# Patient Record
Sex: Female | Born: 1960
Health system: Southern US, Community
[De-identification: ages and names within clinical notes are randomized; demographics above are authoritative.]

## PROBLEM LIST (undated history)

## (undated) DIAGNOSIS — T8859XA Other complications of anesthesia, initial encounter: Secondary | ICD-10-CM

## (undated) DIAGNOSIS — E119 Type 2 diabetes mellitus without complications: Secondary | ICD-10-CM

## (undated) DIAGNOSIS — K5190 Ulcerative colitis, unspecified, without complications: Secondary | ICD-10-CM

## (undated) DIAGNOSIS — K760 Fatty (change of) liver, not elsewhere classified: Secondary | ICD-10-CM

## (undated) DIAGNOSIS — I10 Essential (primary) hypertension: Secondary | ICD-10-CM

## (undated) DIAGNOSIS — J45909 Unspecified asthma, uncomplicated: Secondary | ICD-10-CM

## (undated) HISTORY — PX: ABDOMINAL HYSTERECTOMY: SHX81

## (undated) HISTORY — PX: CHOLECYSTECTOMY: SHX55

## (undated) HISTORY — DX: Essential (primary) hypertension: I10

## (undated) HISTORY — PX: COLONOSCOPY: SHX174

## (undated) HISTORY — DX: Type 2 diabetes mellitus without complications: E11.9

---

## 2007-01-28 DIAGNOSIS — J40 Bronchitis, not specified as acute or chronic: Secondary | ICD-10-CM | POA: Insufficient documentation

## 2008-01-17 DIAGNOSIS — N946 Dysmenorrhea, unspecified: Secondary | ICD-10-CM | POA: Insufficient documentation

## 2008-06-17 DIAGNOSIS — M79609 Pain in unspecified limb: Secondary | ICD-10-CM | POA: Insufficient documentation

## 2008-08-26 DIAGNOSIS — R109 Unspecified abdominal pain: Secondary | ICD-10-CM | POA: Insufficient documentation

## 2009-06-10 DIAGNOSIS — K5190 Ulcerative colitis, unspecified, without complications: Secondary | ICD-10-CM | POA: Insufficient documentation

## 2009-06-10 DIAGNOSIS — T7840XA Allergy, unspecified, initial encounter: Secondary | ICD-10-CM | POA: Insufficient documentation

## 2009-12-13 DIAGNOSIS — S83419A Sprain of medial collateral ligament of unspecified knee, initial encounter: Secondary | ICD-10-CM | POA: Insufficient documentation

## 2010-02-15 DIAGNOSIS — M654 Radial styloid tenosynovitis [de Quervain]: Secondary | ICD-10-CM | POA: Insufficient documentation

## 2010-03-09 DIAGNOSIS — J069 Acute upper respiratory infection, unspecified: Secondary | ICD-10-CM | POA: Insufficient documentation

## 2017-09-28 DIAGNOSIS — E7800 Pure hypercholesterolemia, unspecified: Secondary | ICD-10-CM | POA: Insufficient documentation

## 2017-09-28 DIAGNOSIS — E119 Type 2 diabetes mellitus without complications: Secondary | ICD-10-CM | POA: Insufficient documentation

## 2017-10-01 ENCOUNTER — Other Ambulatory Visit: Payer: Self-pay | Admitting: Internal Medicine

## 2017-10-01 DIAGNOSIS — Z1231 Encounter for screening mammogram for malignant neoplasm of breast: Secondary | ICD-10-CM

## 2017-10-02 ENCOUNTER — Other Ambulatory Visit: Payer: Self-pay | Admitting: Internal Medicine

## 2017-10-02 DIAGNOSIS — R748 Abnormal levels of other serum enzymes: Secondary | ICD-10-CM

## 2017-10-02 DIAGNOSIS — Z8719 Personal history of other diseases of the digestive system: Secondary | ICD-10-CM

## 2017-10-04 ENCOUNTER — Ambulatory Visit
Admission: RE | Admit: 2017-10-04 | Discharge: 2017-10-04 | Disposition: A | Discharge status: Home / Self Care | Payer: BLUE CROSS/BLUE SHIELD | Source: Ambulatory Visit | Attending: Internal Medicine | Admitting: Internal Medicine

## 2017-10-04 DIAGNOSIS — R748 Abnormal levels of other serum enzymes: Secondary | ICD-10-CM

## 2017-10-04 DIAGNOSIS — Z8719 Personal history of other diseases of the digestive system: Secondary | ICD-10-CM | POA: Diagnosis not present

## 2017-10-04 DIAGNOSIS — Z9049 Acquired absence of other specified parts of digestive tract: Secondary | ICD-10-CM | POA: Insufficient documentation

## 2017-10-04 DIAGNOSIS — R932 Abnormal findings on diagnostic imaging of liver and biliary tract: Secondary | ICD-10-CM | POA: Diagnosis not present

## 2017-10-30 ENCOUNTER — Encounter (HOSPITAL_COMMUNITY): Payer: Self-pay

## 2017-10-30 ENCOUNTER — Ambulatory Visit
Admission: RE | Admit: 2017-10-30 | Discharge: 2017-10-30 | Disposition: A | Discharge status: Home / Self Care | Payer: BLUE CROSS/BLUE SHIELD | Source: Ambulatory Visit | Attending: Internal Medicine | Admitting: Internal Medicine

## 2017-10-30 DIAGNOSIS — Z1231 Encounter for screening mammogram for malignant neoplasm of breast: Secondary | ICD-10-CM | POA: Diagnosis not present

## 2017-11-14 ENCOUNTER — Inpatient Hospital Stay
Admission: RE | Admit: 2017-11-14 | Discharge: 2017-11-14 | Disposition: A | Discharge status: Home / Self Care | Payer: Self-pay | Source: Ambulatory Visit | Attending: *Deleted | Admitting: *Deleted

## 2017-11-14 ENCOUNTER — Other Ambulatory Visit: Payer: Self-pay | Admitting: *Deleted

## 2017-11-14 DIAGNOSIS — Z9289 Personal history of other medical treatment: Secondary | ICD-10-CM

## 2018-01-07 DIAGNOSIS — Z8719 Personal history of other diseases of the digestive system: Secondary | ICD-10-CM | POA: Insufficient documentation

## 2018-01-07 DIAGNOSIS — R030 Elevated blood-pressure reading, without diagnosis of hypertension: Secondary | ICD-10-CM | POA: Insufficient documentation

## 2018-01-07 DIAGNOSIS — R748 Abnormal levels of other serum enzymes: Secondary | ICD-10-CM | POA: Insufficient documentation

## 2018-10-10 DIAGNOSIS — I10 Essential (primary) hypertension: Secondary | ICD-10-CM | POA: Insufficient documentation

## 2018-10-21 ENCOUNTER — Other Ambulatory Visit: Payer: Self-pay | Admitting: Internal Medicine

## 2018-10-21 DIAGNOSIS — Z1231 Encounter for screening mammogram for malignant neoplasm of breast: Secondary | ICD-10-CM

## 2018-11-18 ENCOUNTER — Ambulatory Visit
Admission: RE | Admit: 2018-11-18 | Discharge: 2018-11-18 | Disposition: A | Discharge status: Home / Self Care | Payer: BC Managed Care – PPO | Source: Ambulatory Visit | Attending: Internal Medicine | Admitting: Internal Medicine

## 2018-11-18 DIAGNOSIS — Z1231 Encounter for screening mammogram for malignant neoplasm of breast: Secondary | ICD-10-CM | POA: Diagnosis not present

## 2018-11-21 ENCOUNTER — Other Ambulatory Visit: Payer: Self-pay | Admitting: Internal Medicine

## 2018-11-21 DIAGNOSIS — R921 Mammographic calcification found on diagnostic imaging of breast: Secondary | ICD-10-CM

## 2018-11-21 DIAGNOSIS — N6310 Unspecified lump in the right breast, unspecified quadrant: Secondary | ICD-10-CM

## 2018-11-21 DIAGNOSIS — R928 Other abnormal and inconclusive findings on diagnostic imaging of breast: Secondary | ICD-10-CM

## 2018-12-05 ENCOUNTER — Ambulatory Visit
Admission: RE | Admit: 2018-12-05 | Discharge: 2018-12-05 | Disposition: A | Discharge status: Home / Self Care | Payer: BC Managed Care – PPO | Source: Ambulatory Visit | Attending: Internal Medicine | Admitting: Internal Medicine

## 2018-12-05 DIAGNOSIS — N6310 Unspecified lump in the right breast, unspecified quadrant: Secondary | ICD-10-CM | POA: Diagnosis present

## 2018-12-05 DIAGNOSIS — R921 Mammographic calcification found on diagnostic imaging of breast: Secondary | ICD-10-CM

## 2018-12-05 DIAGNOSIS — R928 Other abnormal and inconclusive findings on diagnostic imaging of breast: Secondary | ICD-10-CM | POA: Insufficient documentation

## 2018-12-26 ENCOUNTER — Other Ambulatory Visit (HOSPITAL_COMMUNITY): Payer: Self-pay | Admitting: Internal Medicine

## 2018-12-26 ENCOUNTER — Ambulatory Visit
Admission: RE | Admit: 2018-12-26 | Discharge: 2018-12-26 | Disposition: A | Discharge status: Home / Self Care | Payer: BC Managed Care – PPO | Source: Ambulatory Visit | Attending: Internal Medicine | Admitting: Internal Medicine

## 2018-12-26 ENCOUNTER — Other Ambulatory Visit: Payer: Self-pay | Admitting: Internal Medicine

## 2018-12-26 ENCOUNTER — Other Ambulatory Visit: Payer: Self-pay

## 2018-12-26 DIAGNOSIS — R600 Localized edema: Secondary | ICD-10-CM

## 2019-01-14 ENCOUNTER — Ambulatory Visit (INDEPENDENT_AMBULATORY_CARE_PROVIDER_SITE_OTHER): Payer: BC Managed Care – PPO | Admitting: Vascular Surgery

## 2019-01-14 ENCOUNTER — Encounter (INDEPENDENT_AMBULATORY_CARE_PROVIDER_SITE_OTHER): Payer: Self-pay

## 2019-01-14 ENCOUNTER — Other Ambulatory Visit: Payer: Self-pay

## 2019-01-14 ENCOUNTER — Encounter (INDEPENDENT_AMBULATORY_CARE_PROVIDER_SITE_OTHER): Payer: Self-pay | Admitting: Vascular Surgery

## 2019-01-14 VITALS — BP 135/83 | HR 77 | Resp 16 | Wt 230.0 lb

## 2019-01-14 DIAGNOSIS — I10 Essential (primary) hypertension: Secondary | ICD-10-CM

## 2019-01-14 DIAGNOSIS — E119 Type 2 diabetes mellitus without complications: Secondary | ICD-10-CM

## 2019-01-14 DIAGNOSIS — I890 Lymphedema, not elsewhere classified: Secondary | ICD-10-CM | POA: Diagnosis not present

## 2019-01-14 DIAGNOSIS — M79602 Pain in left arm: Secondary | ICD-10-CM | POA: Diagnosis not present

## 2019-01-14 NOTE — Assessment & Plan Note (Signed)
Left arm Patient has persistent pain and swelling in the left arm a couple of years after a trauma to the left shoulder and upper arm area.  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  At this point, a compression sleeve was prescribed to the patient and I believe she would benefit from a lymphedema pump of the left arm.  Continue to elevate the arm and exercise it as tolerated will be helpful.  If she continues to have symptoms despite appropriate compression, elevation, and the addition of a lymphedema pump I would likely recommend a venogram of the left upper extremity to evaluate the central venous circulation more thoroughly than can be seen on ultrasound.  She may also need reevaluated by an orthopedic specialist to look at her shoulder again as there could be continued issues coming from this.  I have discussed the pathophysiology and natural history of lymphedema with the patient today.  She voices her understanding and is agreeable to proceed with our plan of care.

## 2019-01-14 NOTE — Progress Notes (Signed)
Patient ID: Connie Salazar, female   DOB: 1960-07-21, 58 y.o.   MRN: [id number]  Chief Complaint  Patient presents with   New Patient (Initial Visit)    ref Letitia LibraJohnston UE lymphedema    HPI Connie KocherRegina Majewski is a 58 y.o. female.  I am asked to see the patient by Dr. Letitia LibraJohnston for evaluation of arm swelling.  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  It took a very long time for the pain in that area to get better, but even after the pain improved the left arm remains swollen.  Despite attempting to elevate her arm, sleep with the arm straight, use the arm normally, she continues to have left arm swelling.  She had a negative ultrasound for DVT a couple of weeks ago and does not have a history of surgery on the left arm or axilla.  No previous history of DVT or superficial thrombus to her knowledge.  She denies any fevers or chills.  No chest pain or shortness of breath.  No right arm symptoms.  At this point, her primary care physician told her she had lymphedema and referred her for further evaluation and treatment.     Past Medical History:  Diagnosis Date   Diabetes mellitus without complication (HCC)    Hypertension     Past Surgical History:  Procedure Laterality Date   ABDOMINAL HYSTERECTOMY     CHOLECYSTECTOMY     COLONOSCOPY       Family History  Problem Relation Age of Onset   Breast cancer Mother 3940  No bleeding disorders, clotting disorders, autoimmune diseases or aneurysms   Social History   Tobacco Use   Smoking status: Never Smoker   Smokeless tobacco: Never Used  Substance Use Topics   Alcohol use: Yes    Alcohol/week: 1.0 standard drinks    Types: 1 Glasses of wine per week    Comment: ocassionally   Drug use: Never    Allergies  Allergen Reactions   Mesalamine Anxiety    Other reaction(s): Abdominal Pain    Current Outpatient Medications  Medication Sig Dispense Refill    acetaminophen (TYLENOL) 500 MG tablet Take by mouth.     atorvastatin (LIPITOR) 40 MG tablet      lisinopril (ZESTRIL) 5 MG tablet Take 5 mg by mouth daily.     metFORMIN (GLUCOPHAGE) 500 MG tablet Take by mouth.     No current facility-administered medications for this visit.       REVIEW OF SYSTEMS (Negative unless checked)  Constitutional: [] Weight loss  [] Fever  [] Chills Cardiac: [] Chest pain   [] Chest pressure   [] Palpitations   [] Shortness of breath when laying flat   [] Shortness of breath at rest   [] Shortness of breath with exertion. Vascular:  [] Pain in legs with walking   [] Pain in legs at rest   [] Pain in legs when laying flat   [] Claudication   [] Pain in feet when walking  [] Pain in feet at rest  [] Pain in feet when laying flat   [] History of DVT   [] Phlebitis   [] Swelling in legs   [] Varicose veins   [] Non-healing ulcers X positive for swelling in left arm  Pulmonary:   [] Uses home oxygen   [] Productive cough   [] Hemoptysis   [] Wheeze  [] COPD   [] Asthma Neurologic:  [] Dizziness  [] Blackouts   [] Seizures   [] History of stroke   [] History  of TIA  [] Aphasia   [] Temporary blindness   [] Dysphagia   [] Weakness or numbness in arms   [] Weakness or numbness in legs Musculoskeletal:  [x] Arthritis   [] Joint swelling   [] Joint pain   [] Low back pain Hematologic:  [] Easy bruising  [] Easy bleeding   [] Hypercoagulable state   [] Anemic  [] Hepatitis Gastrointestinal:  [] Blood in stool   [] Vomiting blood  [] Gastroesophageal reflux/heartburn   [] Abdominal pain Genitourinary:  [] Chronic kidney disease   [] Difficult urination  [] Frequent urination  [] Burning with urination   [] Hematuria Skin:  [] Rashes   [] Ulcers   [] Wounds Psychological:  [] History of anxiety   []  History of major depression.    Physical Exam BP 135/83 (BP Location: Right Arm)    Pulse 77    Resp 16    Wt 230 lb (104.3 kg)  Gen:  WD/WN, NAD Head: [place]/AT, No temporalis wasting.  Ear/Nose/Throat: Hearing grossly intact, nares  w/o erythema or drainage, oropharynx w/o Erythema/Exudate Eyes: Conjunctiva clear, sclera non-icteric  Neck: trachea midline.  No JVD.  Pulmonary:  Good air movement, respirations not labored, no use of accessory muscles  Cardiac: RRR, no JVD Vascular:  Vessel Right Left  Radial 1+ Palpable Palpable                                   Gastrointestinal:. No masses, surgical incisions, or scars. Musculoskeletal: M/S 5/5 throughout.  Extremities without ischemic changes.  No deformity or atrophy.  1-2+ left upper extremity edema. Neurologic: Sensation grossly intact in extremities.  Symmetrical.  Speech is fluent. Motor exam as listed above. Psychiatric: Judgment intact, Mood & affect appropriate for pt's clinical situation. Dermatologic: No rashes or ulcers noted.  No cellulitis or open wounds.    Radiology US Venous Img Upper Uni Left  Result Date: 12/26/2018 CLINICAL DATA:  58 year old female with left upper extremity edema EXAM: LEFT UPPER EXTREMITY VENOUS DOPPLER ULTRASOUND TECHNIQUE: Gray-scale sonography with graded compression, as well as color Doppler and duplex ultrasound were performed to evaluate the upper extremity deep venous system from the level of the subclavian vein and including the jugular, axillary, basilic, radial, ulnar and upper cephalic vein. Spectral Doppler was utilized to evaluate flow at rest and with distal augmentation maneuvers. COMPARISON:  None. FINDINGS: Contralateral Subclavian Vein: Respiratory phasicity is normal and symmetric with the symptomatic side. No evidence of thrombus. Normal compressibility. Internal Jugular Vein: No evidence of thrombus. Normal compressibility, respiratory phasicity and response to augmentation. Subclavian Vein: No evidence of thrombus. Normal compressibility, respiratory phasicity and response to augmentation. Axillary Vein: No evidence of thrombus. Normal compressibility, respiratory phasicity and response to augmentation.  Cephalic Vein: No evidence of thrombus. Normal compressibility, respiratory phasicity and response to augmentation. Basilic Vein: No evidence of thrombus. Normal compressibility, respiratory phasicity and response to augmentation. Brachial Veins: No evidence of thrombus. Normal compressibility, respiratory phasicity and response to augmentation. Radial Veins: No evidence of thrombus. Normal compressibility, respiratory phasicity and response to augmentation. Ulnar Veins: No evidence of thrombus. Normal compressibility, respiratory phasicity and response to augmentation. Venous Reflux:  None visualized. Other Findings:  None visualized. IMPRESSION: No evidence of DVT within the left upper extremity. Electronically Signed   By: Jacqulynn Cadet M.D.   On: 12/26/2018 13:58    Labs No results found for this or any previous visit (from the past 2160 hour(s)).  Assessment/Plan:  Type 2 diabetes mellitus without complication, without long-term current use of insulin (  HCC) blood glucose control important in reducing the progression of atherosclerotic disease. Also, involved in wound healing. On appropriate medications.   Essential hypertension blood pressure control important in reducing the progression of atherosclerotic disease. On appropriate oral medications.   Pain in limb Patient has persistent pain and swelling in the left arm a couple of years after a trauma to the left shoulder and upper arm area.  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  At this point, a compression sleeve was prescribed to the patient and I believe she would benefit from a lymphedema pump of the left arm.  Continue to elevate the arm and exercise it as tolerated will be helpful.  If she continues to have symptoms despite appropriate compression, elevation, and the addition of a lymphedema pump I would likely recommend a venogram of the left upper extremity to evaluate the central venous circulation more  thoroughly than can be seen on ultrasound.  She may also need reevaluated by an orthopedic specialist to look at her shoulder again as there could be continued issues coming from this.  I have discussed the pathophysiology and natural history of lymphedema with the patient today.  She voices her understanding and is agreeable to proceed with our plan of care.  Lymphedema Left arm Patient has persistent pain and swelling in the left arm a couple of years after a trauma to the left shoulder and upper arm area.  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  At this point, a compression sleeve was prescribed to the patient and I believe she would benefit from a lymphedema pump of the left arm.  Continue to elevate the arm and exercise it as tolerated will be helpful.  If she continues to have symptoms despite appropriate compression, elevation, and the addition of a lymphedema pump I would likely recommend a venogram of the left upper extremity to evaluate the central venous circulation more thoroughly than can be seen on ultrasound.  She may also need reevaluated by an orthopedic specialist to look at her shoulder again as there could be continued issues coming from this.  I have discussed the pathophysiology and natural history of lymphedema with the patient today.  She voices her understanding and is agreeable to proceed with our plan of care.      Festus Barren 01/14/2019, 9:07 AM   This note was created with Dragon medical transcription system.  Any errors from dictation are unintentional.

## 2019-01-14 NOTE — Patient Instructions (Signed)

## 2019-01-14 NOTE — Assessment & Plan Note (Signed)
blood pressure control important in reducing the progression of atherosclerotic disease. On appropriate oral medications.  

## 2019-01-14 NOTE — Assessment & Plan Note (Signed)
blood glucose control important in reducing the progression of atherosclerotic disease. Also, involved in wound healing. On appropriate medications.  

## 2019-01-14 NOTE — Assessment & Plan Note (Signed)
Patient has persistent pain and swelling in the left arm a couple of years after a trauma to the left shoulder and upper arm area.  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  At this point, a compression sleeve was prescribed to the patient and I believe she would benefit from a lymphedema pump of the left arm.  Continue to elevate the arm and exercise it as tolerated will be helpful.  If she continues to have symptoms despite appropriate compression, elevation, and the addition of a lymphedema pump I would likely recommend a venogram of the left upper extremity to evaluate the central venous circulation more thoroughly than can be seen on ultrasound.  She may also need reevaluated by an orthopedic specialist to look at her shoulder again as there could be continued issues coming from this.  I have discussed the pathophysiology and natural history of lymphedema with the patient today.  She voices her understanding and is agreeable to proceed with our plan of care.

## 2019-02-18 ENCOUNTER — Encounter (INDEPENDENT_AMBULATORY_CARE_PROVIDER_SITE_OTHER): Payer: Self-pay | Admitting: Vascular Surgery

## 2019-02-18 ENCOUNTER — Ambulatory Visit (INDEPENDENT_AMBULATORY_CARE_PROVIDER_SITE_OTHER): Payer: BC Managed Care – PPO | Admitting: Vascular Surgery

## 2019-02-18 ENCOUNTER — Other Ambulatory Visit: Payer: Self-pay

## 2019-02-18 VITALS — BP 143/87 | HR 80 | Resp 19 | Ht 65.0 in | Wt 231.0 lb

## 2019-02-18 DIAGNOSIS — I10 Essential (primary) hypertension: Secondary | ICD-10-CM | POA: Diagnosis not present

## 2019-02-18 DIAGNOSIS — I890 Lymphedema, not elsewhere classified: Secondary | ICD-10-CM | POA: Diagnosis not present

## 2019-02-18 DIAGNOSIS — M79602 Pain in left arm: Secondary | ICD-10-CM

## 2019-02-18 DIAGNOSIS — E119 Type 2 diabetes mellitus without complications: Secondary | ICD-10-CM

## 2019-02-18 NOTE — Progress Notes (Signed)
MRN : [id number]  Connie Salazar is a 58 y.o. (1960/03/28) female who presents with chief complaint of  Chief Complaint  Patient presents with  . Follow-up  .  History of Present Illness: Patient returns today in follow up of her left arm lymphedema.  She has been diligently wearing her compression sleeve and had to get a slightly more robust sleeve as her initial one was rolling down.  Wearing this daily has resulted in a significant improvement in her left upper extremity swelling that is easily noticeable today.  She does say her arm is actually hurting more now than it was and she thinks this may be coming from her shoulder.  She has been evaluated by the lymphedema pump company but has not yet received the device  Current Outpatient Medications  Medication Sig Dispense Refill  . acetaminophen (TYLENOL) 500 MG tablet Take by mouth.    Marland Kitchen atorvastatin (LIPITOR) 40 MG tablet     . lisinopril (ZESTRIL) 5 MG tablet Take 5 mg by mouth daily.    . metFORMIN (GLUCOPHAGE) 500 MG tablet Take by mouth.     No current facility-administered medications for this visit.    Past Medical History:  Diagnosis Date  . Diabetes mellitus without complication ([place])   . Hypertension     Past Surgical History:  Procedure Laterality Date  . ABDOMINAL HYSTERECTOMY    . CHOLECYSTECTOMY    . COLONOSCOPY      Social History Social History   Tobacco Use  . Smoking status: Never Smoker  . Smokeless tobacco: Never Used  Substance Use Topics  . Alcohol use: Yes    Alcohol/week: 1.0 standard drinks    Types: 1 Glasses of wine per week    Comment: ocassionally  . Drug use: Never    Family History  Problem Relation Age of Onset  . Breast cancer Mother 12     Allergies  Allergen Reactions  . Mesalamine Anxiety    Other reaction(s): Abdominal Pain     REVIEW OF SYSTEMS (Negative unless checked)  Constitutional: [] Weight loss  [] Fever  [] Chills Cardiac: [] Chest pain   [] Chest pressure    [] Palpitations   [] Shortness of breath when laying flat   [] Shortness of breath at rest   [] Shortness of breath with exertion. Vascular:  [] Pain in legs with walking   [] Pain in legs at rest   [] Pain in legs when laying flat   [] Claudication   [] Pain in feet when walking  [] Pain in feet at rest  [] Pain in feet when laying flat   [] History of DVT   [] Phlebitis   [] Swelling in legs   [] Varicose veins   [] Non-healing ulcers Pulmonary:   [] Uses home oxygen   [] Productive cough   [] Hemoptysis   [] Wheeze  [] COPD   [] Asthma Neurologic:  [] Dizziness  [] Blackouts   [] Seizures   [] History of stroke   [] History of TIA  [] Aphasia   [] Temporary blindness   [] Dysphagia   [] Weakness or numbness in arms   [] Weakness or numbness in legs Musculoskeletal:  [x] Arthritis   [] Joint swelling   [] Joint pain   [] Low back pain Hematologic:  [] Easy bruising  [] Easy bleeding   [] Hypercoagulable state   [] Anemic   Gastrointestinal:  [] Blood in stool   [] Vomiting blood  [] Gastroesophageal reflux/heartburn   [] Abdominal pain Genitourinary:  [] Chronic kidney disease   [] Difficult urination  [] Frequent urination  [] Burning with urination   [] Hematuria Skin:  [] Rashes   [] Ulcers   [] Wounds Psychological:  []   History of anxiety   []  History of major depression.  Physical Examination  BP (!) 143/87 (BP Location: Right Arm)   Pulse 80   Resp 19   Ht 5\' 5"  (1.651 m)   Wt 231 lb (104.8 kg)   BMI 38.44 kg/m  Gen:  WD/WN, NAD Head: [place]/AT, No temporalis wasting. Ear/Nose/Throat: Hearing grossly intact, nares w/o erythema or drainage Eyes: Conjunctiva clear. Sclera non-icteric Neck: Supple.  Trachea midline Pulmonary:  Good air movement, no use of accessory muscles.  Cardiac: RRR, no JVD Vascular:  Vessel Right Left  Radial Palpable Palpable                           Musculoskeletal: M/S 5/5 throughout.  No deformity or atrophy.  Fairly mild left upper extremity edema. Neurologic: Sensation grossly intact in extremities.   Symmetrical.  Speech is fluent.  Psychiatric: Judgment intact, Mood & affect appropriate for pt's clinical situation. Dermatologic: No rashes or ulcers noted.  No cellulitis or open wounds.       Labs No results found for this or any previous visit (from the past 2160 hour(s)).  Radiology No results found.  Assessment/Plan Type 2 diabetes mellitus without complication, without long-term current use of insulin (HCC) blood glucose control important in reducing the progression of atherosclerotic disease. Also, involved in wound healing. On appropriate medications.   Essential hypertension blood pressure control important in reducing the progression of atherosclerotic disease. On appropriate oral medications.  Pain in limb Left arm pain is actually a little worse after her swelling has improved.  Would suspect there is some sort of shoulder or musculoskeletal issue going on.  Lymphedema Wearing her compression sleeve on her left arm has significantly improved her swelling.  I will hold on the consideration for venogram but will continue to proceed with the expected use of the lymphedema pump at this time.  I do think the lymphedema pump will be a good adjuvant therapy to improve her symptoms in the left arm and continue to reduce her swelling.  I will plan to see her back in 3 months and if her swelling is worsened we can certainly consider a venogram in the future    , MD  02/18/2019 9:10 AM    This note was created with Dragon medical transcription system.  Any errors from dictation are purely unintentional

## 2019-02-18 NOTE — Patient Instructions (Signed)

## 2019-02-18 NOTE — Assessment & Plan Note (Signed)
Wearing her compression sleeve on her left arm has significantly improved her swelling.  I will hold on the consideration for venogram but will continue to proceed with the expected use of the lymphedema pump at this time.  I do think the lymphedema pump will be a good adjuvant therapy to improve her symptoms in the left arm and continue to reduce her swelling.  I will plan to see her back in 3 months and if her swelling is worsened we can certainly consider a venogram in the future

## 2019-02-18 NOTE — Assessment & Plan Note (Signed)
Left arm pain is actually a little worse after her swelling has improved.  Would suspect there is some sort of shoulder or musculoskeletal issue going on.

## 2019-03-18 ENCOUNTER — Encounter (INDEPENDENT_AMBULATORY_CARE_PROVIDER_SITE_OTHER): Payer: Self-pay

## 2019-04-28 ENCOUNTER — Encounter (INDEPENDENT_AMBULATORY_CARE_PROVIDER_SITE_OTHER): Payer: Self-pay

## 2019-05-20 ENCOUNTER — Ambulatory Visit (INDEPENDENT_AMBULATORY_CARE_PROVIDER_SITE_OTHER): Payer: Self-pay | Admitting: Vascular Surgery

## 2019-06-03 ENCOUNTER — Ambulatory Visit (INDEPENDENT_AMBULATORY_CARE_PROVIDER_SITE_OTHER): Payer: 59 | Admitting: Vascular Surgery

## 2019-06-03 ENCOUNTER — Encounter (INDEPENDENT_AMBULATORY_CARE_PROVIDER_SITE_OTHER): Payer: Self-pay | Admitting: Vascular Surgery

## 2019-06-03 ENCOUNTER — Other Ambulatory Visit: Payer: Self-pay

## 2019-06-03 VITALS — BP 131/83 | HR 80 | Resp 16 | Ht 65.0 in | Wt 235.0 lb

## 2019-06-03 DIAGNOSIS — E119 Type 2 diabetes mellitus without complications: Secondary | ICD-10-CM | POA: Diagnosis not present

## 2019-06-03 DIAGNOSIS — I10 Essential (primary) hypertension: Secondary | ICD-10-CM

## 2019-06-03 DIAGNOSIS — M79602 Pain in left arm: Secondary | ICD-10-CM | POA: Diagnosis not present

## 2019-06-03 DIAGNOSIS — I890 Lymphedema, not elsewhere classified: Secondary | ICD-10-CM

## 2019-06-03 NOTE — Assessment & Plan Note (Signed)
Even with her swelling getting better, her left arm and shoulder pain seem to be stable to worse.  I recommended she be evaluated by an orthopedic surgeon to see if there are any physical therapy or exercises that may help her arm pain.  Her lymphedema is under better but not perfect control.  Were going to continue to use the lymphedema pump and the sleeve and elevate her arm.

## 2019-06-03 NOTE — Assessment & Plan Note (Signed)
Lymphedema pump has improved her swelling as has the compression sleeve.  Swelling is still present but much better.  Continue to use these agents and we will lengthen her follow-up to 6 months at this point.

## 2019-06-03 NOTE — Progress Notes (Signed)
MRN : [id number]  Connie Salazar is a 59 y.o. (November 11, 1960) female who presents with chief complaint of  Chief Complaint  Patient presents with  . Follow-up    3 mos no studies   .  History of Present Illness: Patient returns today in follow up of her left arm lymphedema.  She did get her lymphedema pump and has been using that daily.  Her swelling is under good control.  Her arms are not symmetric, but there certainly a lot closer than they were 6 months ago.  She is still having a fair bit of pain in her left shoulder that is radiating down the outside of her arm.  She has not yet seen an orthopedic surgeon.  Current Outpatient Medications  Medication Sig Dispense Refill  . acetaminophen (TYLENOL) 500 MG tablet Take by mouth.    Marland Kitchen atorvastatin (LIPITOR) 40 MG tablet     . lisinopril (ZESTRIL) 5 MG tablet Take 5 mg by mouth daily.    . metFORMIN (GLUCOPHAGE) 500 MG tablet Take by mouth.     No current facility-administered medications for this visit.    Past Medical History:  Diagnosis Date  . Diabetes mellitus without complication (HCC)   . Hypertension     Past Surgical History:  Procedure Laterality Date  . ABDOMINAL HYSTERECTOMY    . CHOLECYSTECTOMY    . COLONOSCOPY      Social History   Tobacco Use  . Smoking status: Never Smoker  . Smokeless tobacco: Never Used  Substance Use Topics  . Alcohol use: Yes    Alcohol/week: 1.0 standard drinks    Types: 1 Glasses of wine per week    Comment: ocassionally  . Drug use: Never    Family History  Problem Relation Age of Onset  . Breast cancer Mother 15     Allergies  Allergen Reactions  . Mesalamine Anxiety    Other reaction(s): Abdominal Pain     REVIEW OF SYSTEMS (Negative unless checked)  Constitutional: [] Weight loss  [] Fever  [] Chills Cardiac: [] Chest pain   [] Chest pressure   [] Palpitations   [] Shortness of breath when laying flat   [] Shortness of breath at rest   [] Shortness of breath with exertion.  Vascular:  [] Pain in legs with walking   [] Pain in legs at rest   [] Pain in legs when laying flat   [] Claudication   [] Pain in feet when walking  [] Pain in feet at rest  [] Pain in feet when laying flat   [] History of DVT   [] Phlebitis   [] Swelling in legs   [] Varicose veins   [] Non-healing ulcers Pulmonary:   [] Uses home oxygen   [] Productive cough   [] Hemoptysis   [] Wheeze  [] COPD   [] Asthma Neurologic:  [] Dizziness  [] Blackouts   [] Seizures   [] History of stroke   [] History of TIA  [] Aphasia   [] Temporary blindness   [] Dysphagia   [] Weakness or numbness in arms   [] Weakness or numbness in legs Musculoskeletal:  [x] Arthritis   [] Joint swelling   [] Joint pain   [] Low back pain Hematologic:  [] Easy bruising  [] Easy bleeding   [] Hypercoagulable state   [] Anemic   Gastrointestinal:  [] Blood in stool   [] Vomiting blood  [] Gastroesophageal reflux/heartburn   [] Abdominal pain Genitourinary:  [] Chronic kidney disease   [] Difficult urination  [] Frequent urination  [] Burning with urination   [] Hematuria Skin:  [] Rashes   [] Ulcers   [] Wounds Psychological:  [] History of anxiety   []  History of major depression.  Physical Examination  BP 131/83 (BP Location: Right Arm)   Pulse 80   Resp 16   Ht 5\' 5"  (1.651 m)   Wt 235 lb (106.6 kg)   BMI 39.11 kg/m  Gen:  WD/WN, NAD Head: [place]/AT, No temporalis wasting. Ear/Nose/Throat: Hearing grossly intact, nares w/o erythema or drainage Eyes: Conjunctiva clear. Sclera non-icteric Neck: Supple.  Trachea midline Pulmonary:  Good air movement, no use of accessory muscles.  Cardiac: RRR, no JVD Vascular:  Vessel Right Left  Radial Palpable Palpable                       Musculoskeletal: M/S 5/5 throughout.  No deformity or atrophy. 1+ LUE edema. Neurologic: Sensation grossly intact in extremities.  Symmetrical.  Speech is fluent.  Psychiatric: Judgment intact, Mood & affect appropriate for pt's clinical situation. Dermatologic: No rashes or ulcers noted.   No cellulitis or open wounds.       Labs No results found for this or any previous visit (from the past 2160 hour(s)).  Radiology No results found.  Assessment/Plan Type 2 diabetes mellitus without complication, without long-term current use of insulin (HCC) blood glucose control important in reducing the progression of atherosclerotic disease. Also, involved in wound healing. On appropriate medications.   Essential hypertension blood pressure control important in reducing the progression of atherosclerotic disease. On appropriate oral medications.  Pain in limb Even with her swelling getting better, her left arm and shoulder pain seem to be stable to worse.  I recommended she be evaluated by an orthopedic surgeon to see if there are any physical therapy or exercises that may help her arm pain.  Her lymphedema is under better but not perfect control.  Were going to continue to use the lymphedema pump and the sleeve and elevate her arm.  Lymphedema Lymphedema pump has improved her swelling as has the compression sleeve.  Swelling is still present but much better.  Continue to use these agents and we will lengthen her follow-up to 6 months at this point.    Leotis Pain, MD  06/03/2019 9:17 AM    This note was created with Dragon medical transcription system.  Any errors from dictation are purely unintentional

## 2019-12-02 ENCOUNTER — Ambulatory Visit (INDEPENDENT_AMBULATORY_CARE_PROVIDER_SITE_OTHER): Payer: 59 | Admitting: Vascular Surgery

## 2019-12-03 ENCOUNTER — Other Ambulatory Visit: Payer: Self-pay | Admitting: Internal Medicine

## 2019-12-03 DIAGNOSIS — Z1231 Encounter for screening mammogram for malignant neoplasm of breast: Secondary | ICD-10-CM

## 2019-12-23 ENCOUNTER — Encounter (INDEPENDENT_AMBULATORY_CARE_PROVIDER_SITE_OTHER): Payer: Self-pay | Admitting: Vascular Surgery

## 2019-12-23 ENCOUNTER — Ambulatory Visit (INDEPENDENT_AMBULATORY_CARE_PROVIDER_SITE_OTHER): Payer: 59 | Admitting: Vascular Surgery

## 2019-12-23 ENCOUNTER — Other Ambulatory Visit: Payer: Self-pay

## 2019-12-23 VITALS — BP 118/67 | HR 71 | Ht 65.0 in | Wt 227.0 lb

## 2019-12-23 DIAGNOSIS — M79602 Pain in left arm: Secondary | ICD-10-CM | POA: Diagnosis not present

## 2019-12-23 DIAGNOSIS — E119 Type 2 diabetes mellitus without complications: Secondary | ICD-10-CM | POA: Diagnosis not present

## 2019-12-23 DIAGNOSIS — I890 Lymphedema, not elsewhere classified: Secondary | ICD-10-CM | POA: Diagnosis not present

## 2019-12-23 DIAGNOSIS — I10 Essential (primary) hypertension: Secondary | ICD-10-CM | POA: Diagnosis not present

## 2019-12-23 NOTE — Progress Notes (Signed)
MRN : [id number]  Connie Salazar is a 59 y.o. (09/09/1960) female who presents with chief complaint of  Chief Complaint  Patient presents with  . Follow-up    81mo U/S follow up  .  History of Present Illness: Patient returns today in follow up of her left arm lymphedema.  Her swelling is reasonably stable.  She has her pump which she is using mostly on a daily basis.  She has had a recent episode of tenosynovitis of her left hand and wrist which have caused a significant amount of pain and swelling in that area.  That may have worsened her upper arm swelling some as well.  Current Outpatient Medications  Medication Sig Dispense Refill  . acetaminophen (TYLENOL) 500 MG tablet Take by mouth.    Marland Kitchen atorvastatin (LIPITOR) 40 MG tablet     . lisinopril (ZESTRIL) 5 MG tablet Take 5 mg by mouth daily.    . metFORMIN (GLUCOPHAGE) 500 MG tablet Take by mouth.     No current facility-administered medications for this visit.    Past Medical History:  Diagnosis Date  . Diabetes mellitus without complication (HCC)   . Hypertension     Past Surgical History:  Procedure Laterality Date  . ABDOMINAL HYSTERECTOMY    . CHOLECYSTECTOMY    . COLONOSCOPY       Social History   Tobacco Use  . Smoking status: Never Smoker  . Smokeless tobacco: Never Used  Substance Use Topics  . Alcohol use: Yes    Alcohol/week: 1.0 standard drink    Types: 1 Glasses of wine per week    Comment: ocassionally  . Drug use: Never      Family History  Problem Relation Age of Onset  . Breast cancer Mother 66    Allergies  Allergen Reactions  . Mesalamine Anxiety    Other reaction(s): Abdominal Pain     REVIEW OF SYSTEMS (Negative unless checked)  Constitutional: [] ?Weight loss  [] ?Fever  [] ?Chills Cardiac: [] ?Chest pain   [] ?Chest pressure   [] ?Palpitations   [] ?Shortness of breath when laying flat   [] ?Shortness of breath at rest   [] ?Shortness of breath with exertion. Vascular:  [] ?Pain in  legs with walking   [] ?Pain in legs at rest   [] ?Pain in legs when laying flat   [] ?Claudication   [] ?Pain in feet when walking  [] ?Pain in feet at rest  [] ?Pain in feet when laying flat   [] ?History of DVT   [] ?Phlebitis   [] ?Swelling in legs   [] ?Varicose veins   [] ?Non-healing ulcers Pulmonary:   [] ?Uses home oxygen   [] ?Productive cough   [] ?Hemoptysis   [] ?Wheeze  [] ?COPD   [] ?Asthma Neurologic:  [] ?Dizziness  [] ?Blackouts   [] ?Seizures   [] ?History of stroke   [] ?History of TIA  [] ?Aphasia   [] ?Temporary blindness   [] ?Dysphagia   [] ?Weakness or numbness in arms   [] ?Weakness or numbness in legs Musculoskeletal:  [x] ?Arthritis   [] ?Joint swelling   [] ?Joint pain   [] ?Low back pain Hematologic:  [] ?Easy bruising  [] ?Easy bleeding   [] ?Hypercoagulable state   [] ?Anemic   Gastrointestinal:  [] ?Blood in stool   [] ?Vomiting blood  [] ?Gastroesophageal reflux/heartburn   [] ?Abdominal pain Genitourinary:  [] ?Chronic kidney disease   [] ?Difficult urination  [] ?Frequent urination  [] ?Burning with urination   [] ?Hematuria Skin:  [] ?Rashes   [] ?Ulcers   [] ?Wounds Psychological:  [] ?History of anxiety   [] ? History of major depression.  Physical Examination  BP 118/67   Pulse  71   Ht 5\' 5"  (1.651 m)   Wt 227 lb (103 kg)   BMI 37.77 kg/m  Gen:  WD/WN, NAD Head: [place]/AT, No temporalis wasting. Ear/Nose/Throat: Hearing grossly intact, nares w/o erythema or drainage Eyes: Conjunctiva clear. Sclera non-icteric Neck: Supple.  Trachea midline Pulmonary:  Good air movement, no use of accessory muscles.  Cardiac: RRR, no JVD Vascular:  Vessel Right Left  Radial Palpable Palpable                          PT Palpable Palpable  DP Palpable Palpable   Gastrointestinal: soft, non-tender/non-distended. No guarding/reflex.  Musculoskeletal: M/S 5/5 throughout.  No deformity or atrophy.  1+ left upper extremity edema. Neurologic: Sensation grossly intact in extremities.  Symmetrical.  Speech is fluent.    Psychiatric: Judgment intact, Mood & affect appropriate for pt's clinical situation. Dermatologic: No rashes or ulcers noted.  No cellulitis or open wounds.       Labs No results found for this or any previous visit (from the past 2160 hour(s)).  Radiology No results found.  Assessment/Plan Type 2 diabetes mellitus without complication, without long-term current use of insulin (HCC) blood glucose control important in reducing the progression of atherosclerotic disease. Also, involved in wound healing. On appropriate medications.   Essential hypertension blood pressure control important in reducing the progression of atherosclerotic disease. On appropriate oral medications.  Pain in limb Has recently been treated for tenosynovitis of the left hand which may have exacerbated her left arm symptoms.  Still having some pain from this.  Lymphedema Symptoms are overall reasonably stable.  Elevation of the use her lymphedema pump on at least a daily basis would be recommended.  No major changes at this time.  Follow-up in 1 year.    2161, MD  12/23/2019 1:20 PM    This note was created with Dragon medical transcription system.  Any errors from dictation are purely unintentional

## 2019-12-23 NOTE — Assessment & Plan Note (Signed)
Symptoms are overall reasonably stable.  Elevation of the use her lymphedema pump on at least a daily basis would be recommended.  No major changes at this time.  Follow-up in 1 year.

## 2019-12-31 ENCOUNTER — Other Ambulatory Visit: Payer: Self-pay

## 2019-12-31 ENCOUNTER — Ambulatory Visit
Admission: RE | Admit: 2019-12-31 | Discharge: 2019-12-31 | Disposition: A | Discharge status: Home / Self Care | Payer: 59 | Source: Ambulatory Visit | Attending: Internal Medicine | Admitting: Internal Medicine

## 2019-12-31 DIAGNOSIS — Z1231 Encounter for screening mammogram for malignant neoplasm of breast: Secondary | ICD-10-CM | POA: Diagnosis present

## 2020-05-12 IMAGING — US US BREAST*R* LIMITED INC AXILLA
1 series · 5 of 5 positions shown · non-contrast
Comparison: Previous exam(s).

CLINICAL DATA: Recall from screening mammogram for a possible mass
in the right breast and possible calcifications in the left breast.

EXAM:
DIGITAL DIAGNOSTIC BILATERAL MAMMOGRAM WITH CAD AND TOMO
ULTRASOUND RIGHT BREAST

[Series 1: us breast*right* limited inc axilla · 0.05mm/px · 5 of 5 slices shown]
[im 1/5]
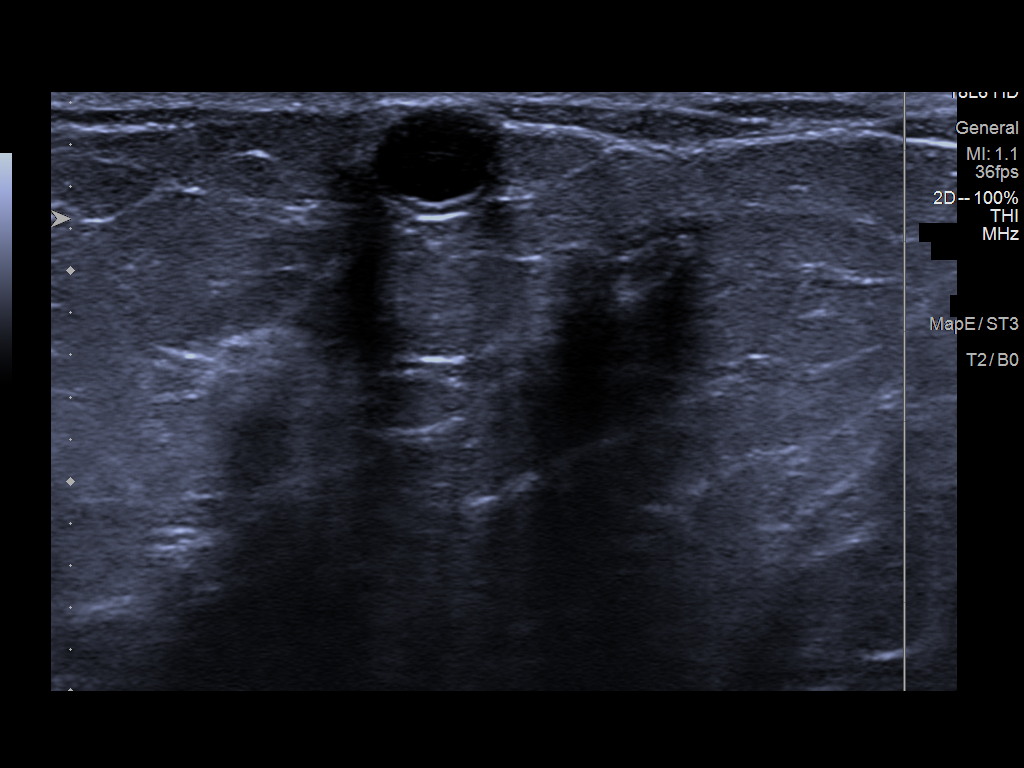
[im 2/5]
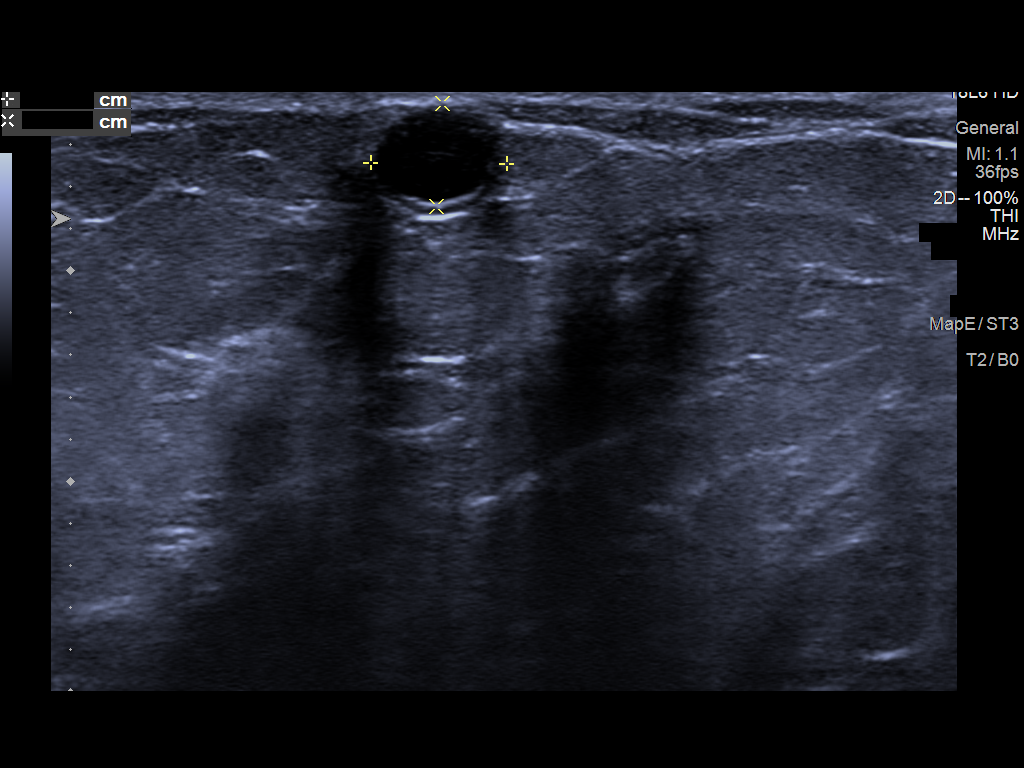
[im 3/5]
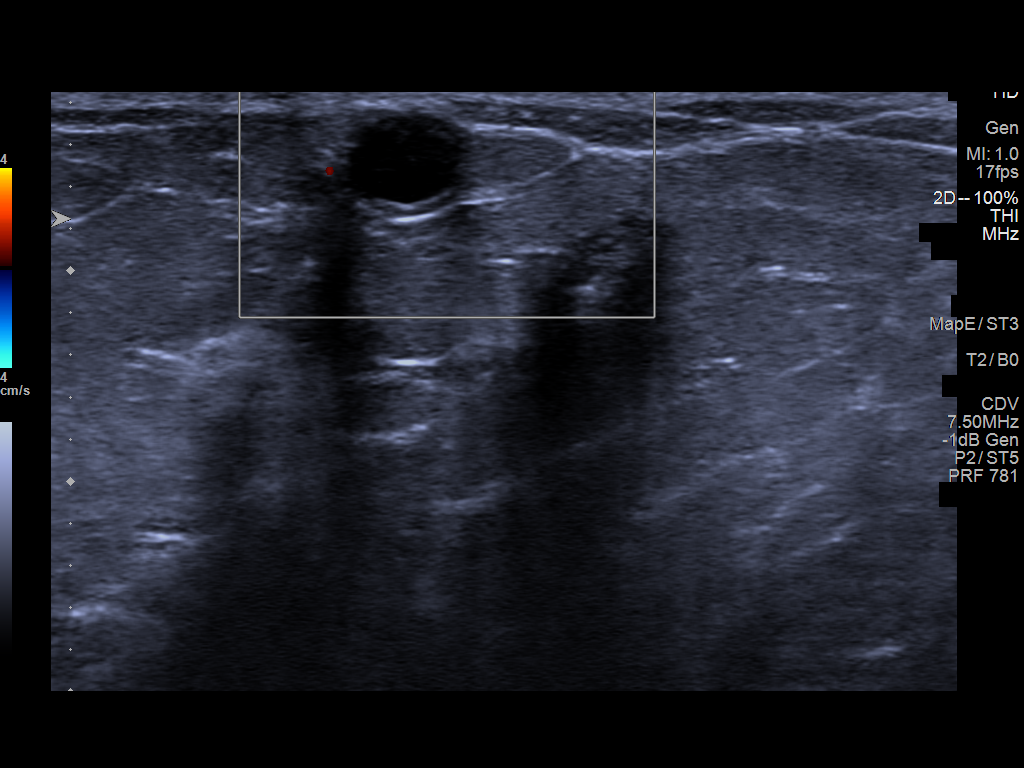
[im 4/5]
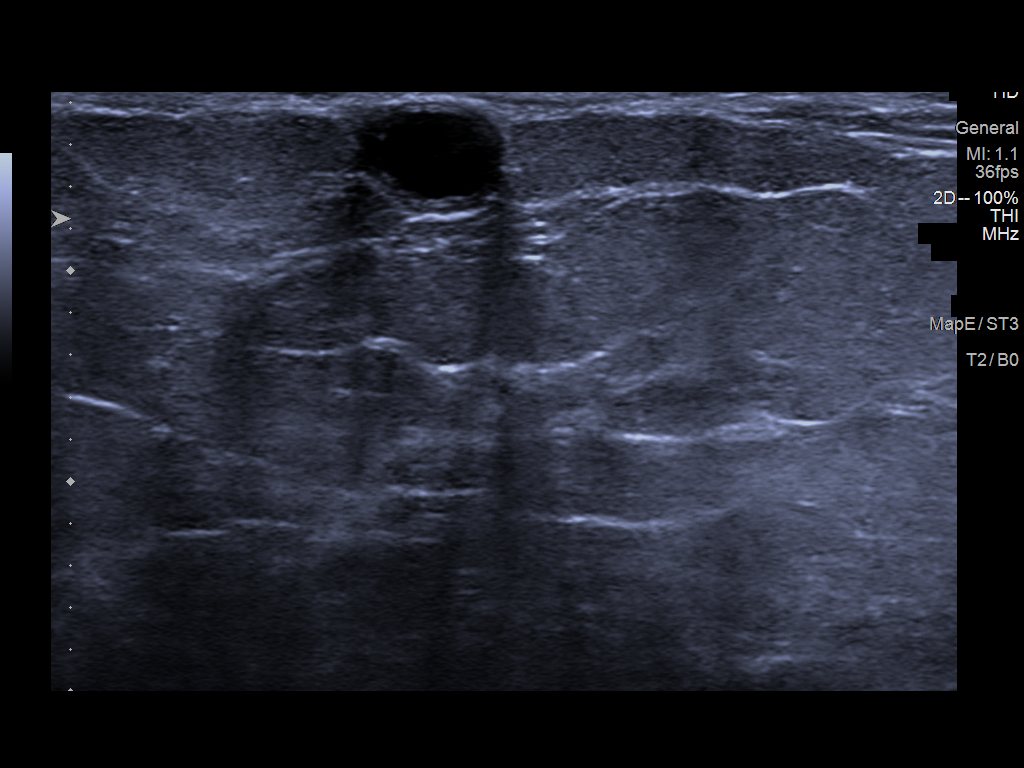
[im 5/5]
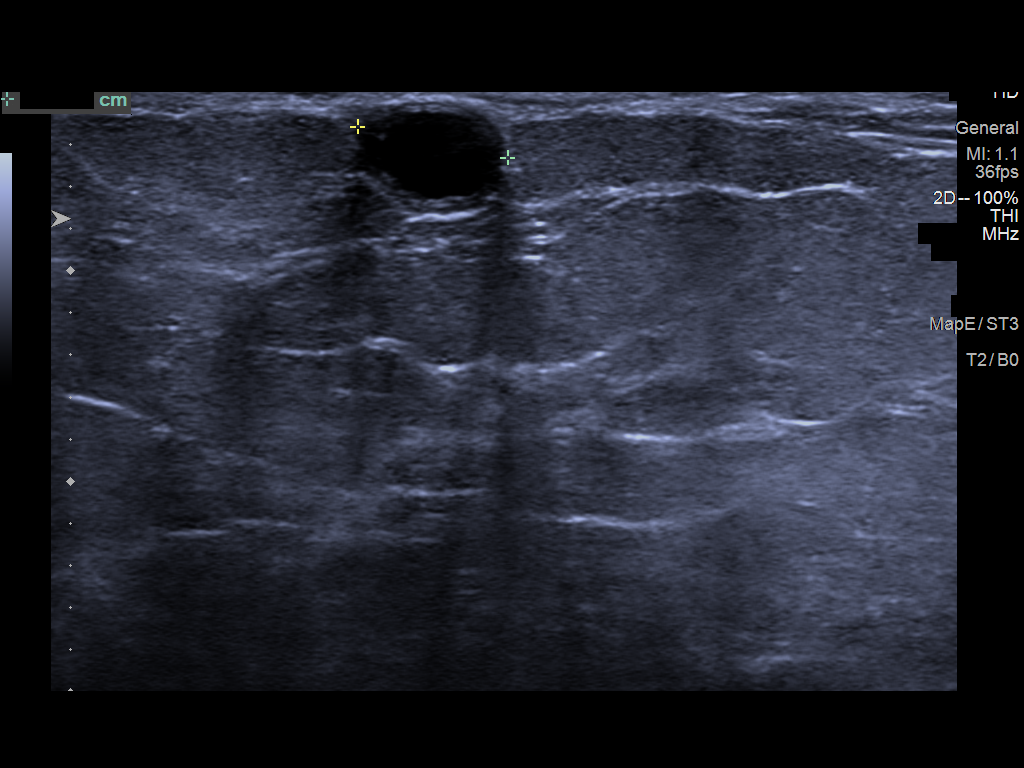

[5 of 5 positions shown; findings below may reference images not displayed]

ACR Breast Density Category c: The breast tissue is heterogeneously
dense, which may obscure small masses.
FINDINGS: Additional views including magnification views demonstrate benign
dermal calcifications in the left inframammary fold, corresponding
to the calcifications seen on screening mammogram.

Additional views including spot compression demonstrate a round
circumscribed mass in the upper inner right breast measuring 0.7 cm.
Mammographic images were processed with CAD.

Targeted right breast ultrasound was performed.

At 2 o'clock 2 cm from the nipple a benign simple cyst measures
x 0.5 x 0.7 cm. This corresponds to the circumscribed mass seen on
the mammogram.
IMPRESSION: Benign cyst in the right breast and benign dermal calcifications in
the left breast. No evidence of malignancy on either side.

RECOMMENDATION:
Recommend routine annual screening mammogram in 1 year.

I have discussed the findings and recommendations with the patient.
If applicable, a reminder letter will be sent to the patient
regarding the next appointment.

BI-RADS CATEGORY  2: Benign.

## 2020-06-02 IMAGING — US US EXTREM  UP VENOUS*L*
1 series · 13 of 24 positions shown · non-contrast
Comparison: None.

CLINICAL DATA: 58-year-old female with left upper extremity edema



[Series 1: us extrem up venous*left* · 0.10mm/px · 13 of 31 slices shown]
[im 1/31]
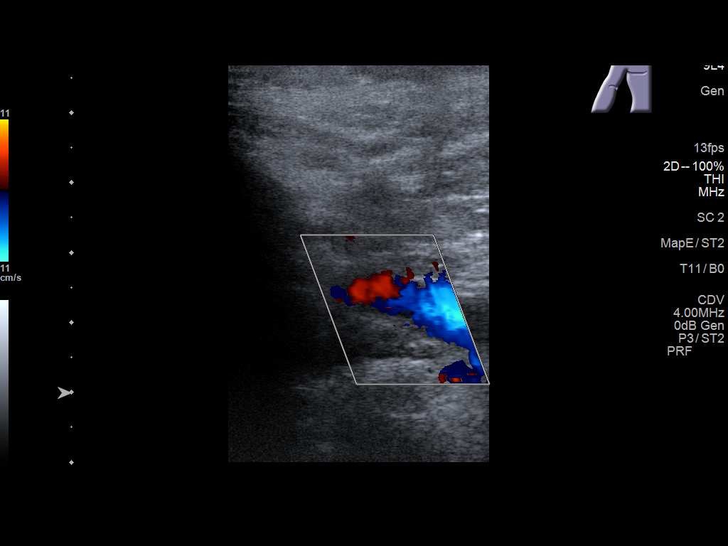
[im 3/31]
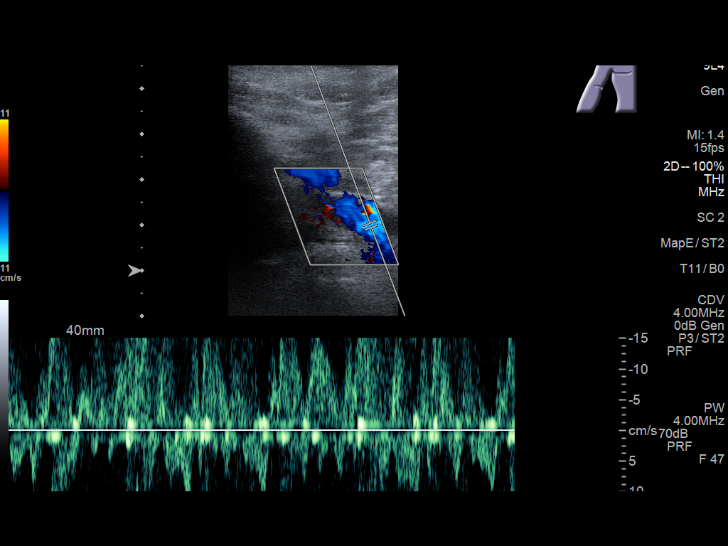
[im 6/31]
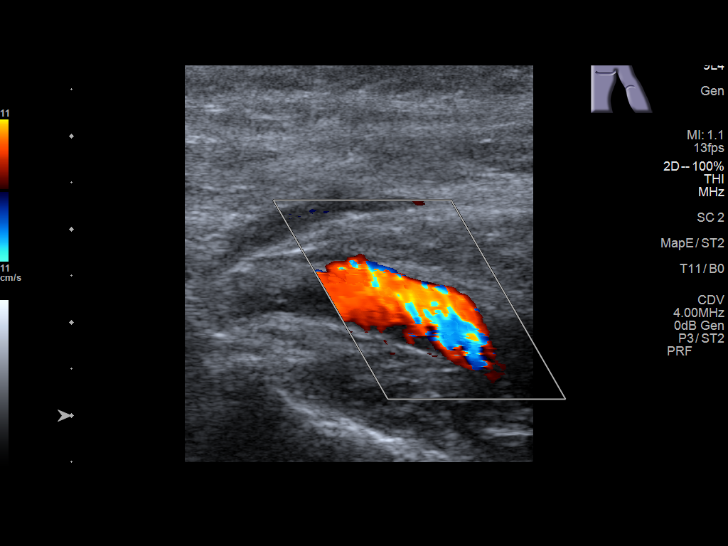
[im 8/31]
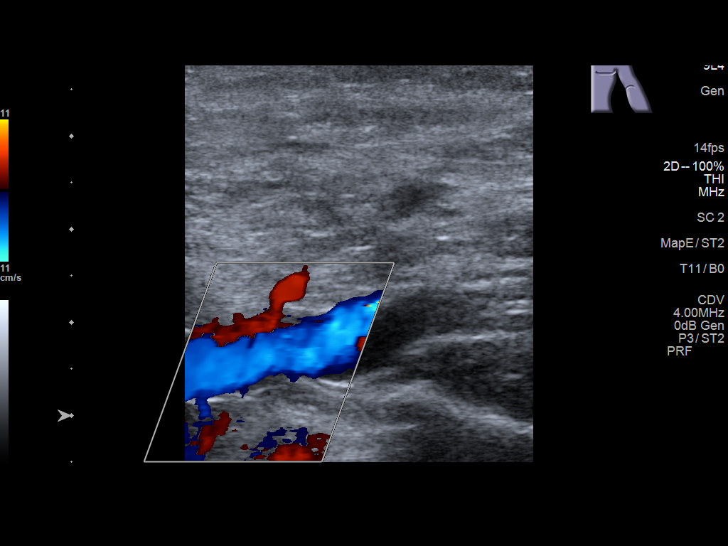
[im 11/31]
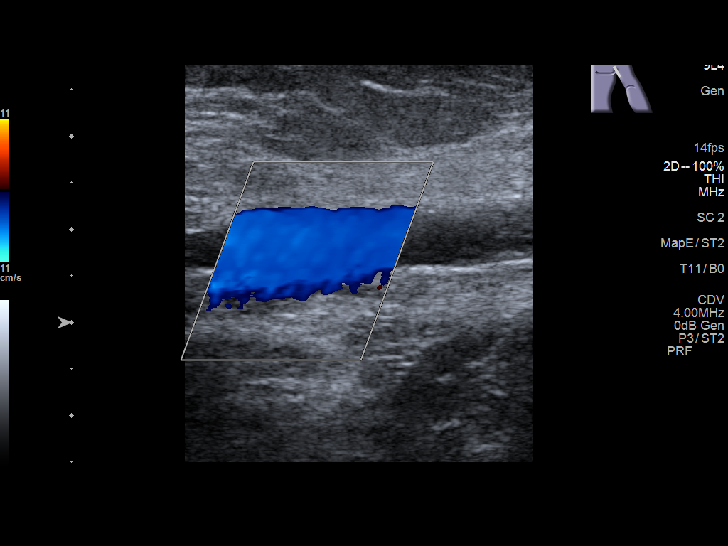
[im 14/31]
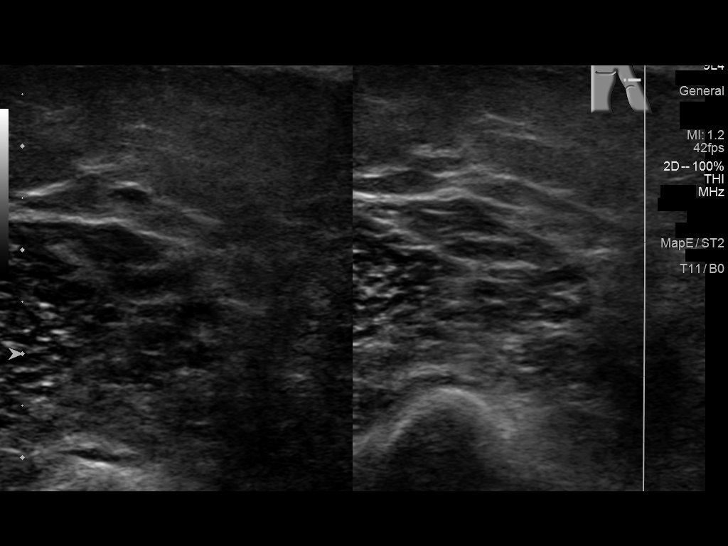
[im 16/31]
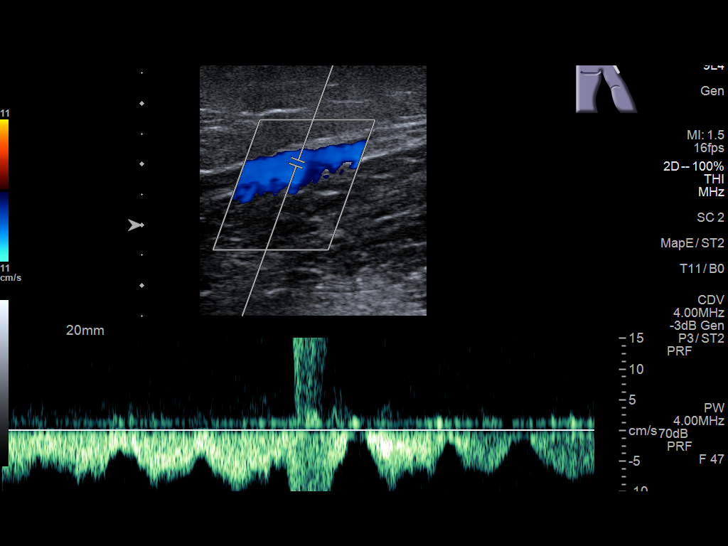
[im 17/31]
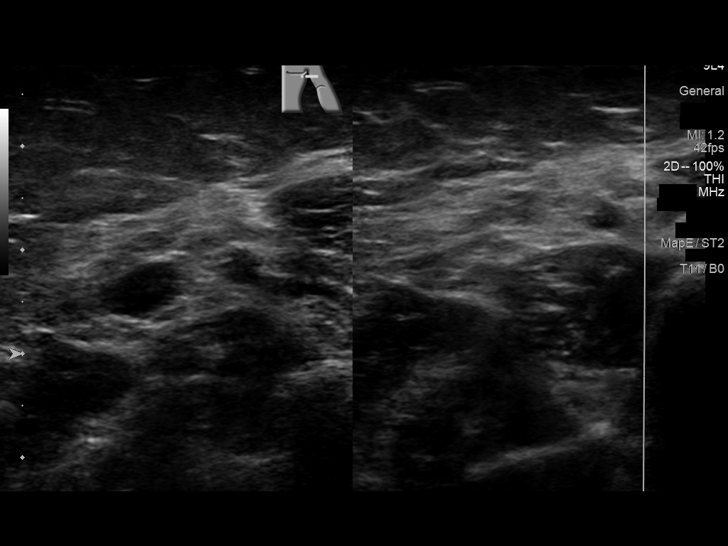
[im 20/31]
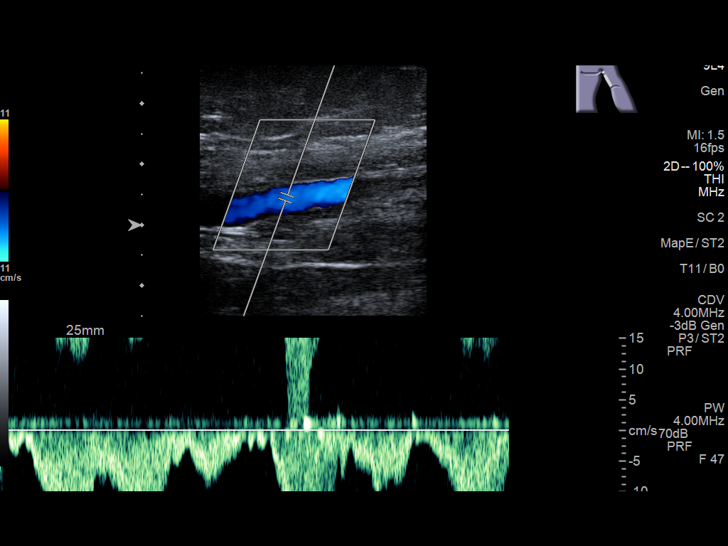
[im 23/31]
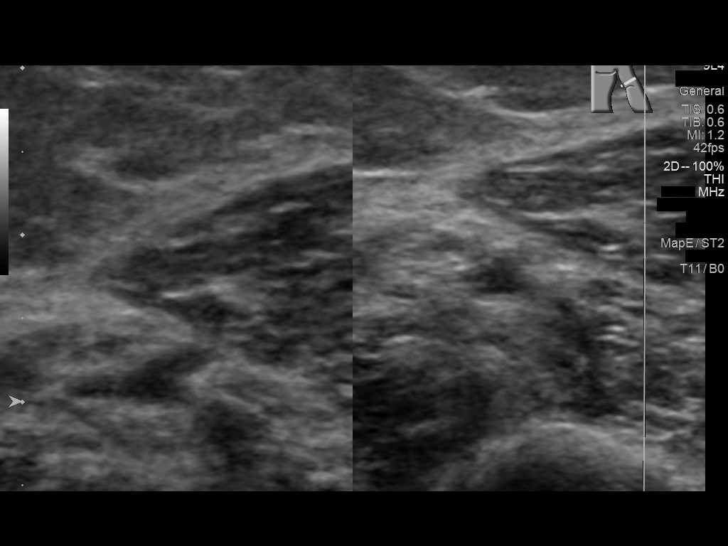
[im 25/31]
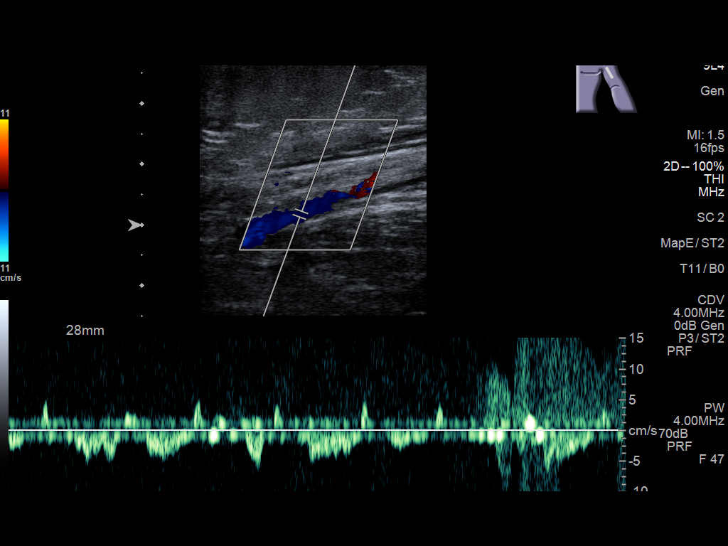
[im 28/31]
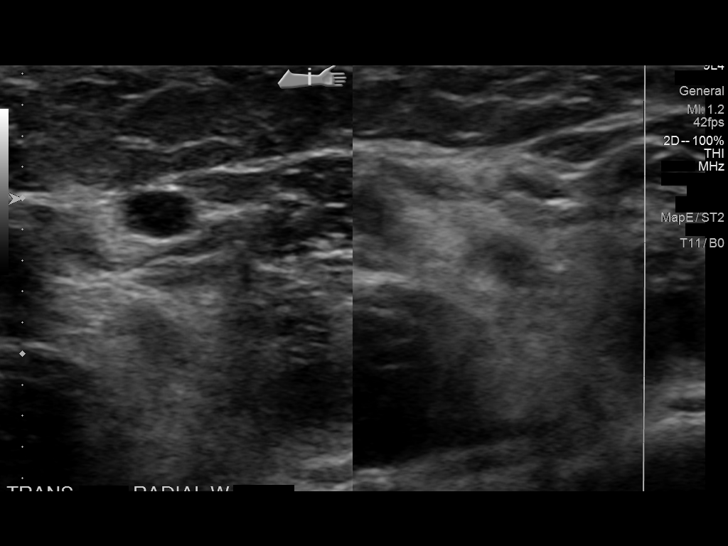
[im 31/31]
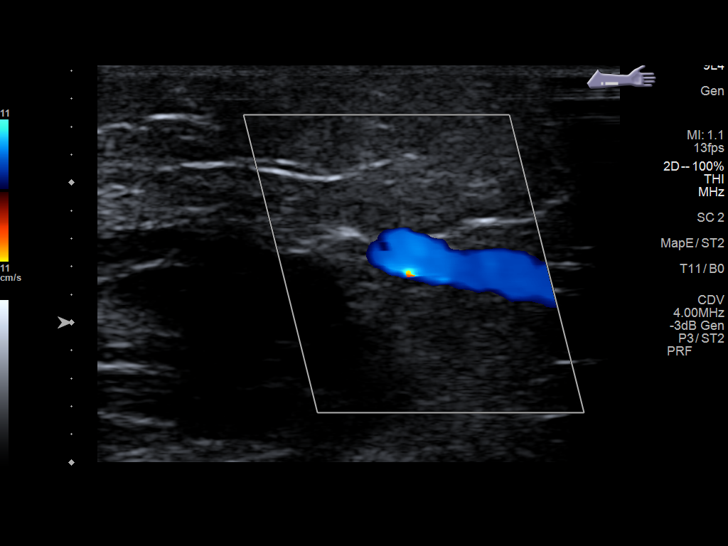

[13 of 24 positions shown; findings below may reference images not displayed]

FINDINGS: Contralateral Subclavian Vein: Respiratory phasicity is normal and
symmetric with the symptomatic side. No evidence of thrombus. Normal
compressibility.

Internal Jugular Vein: No evidence of thrombus. Normal
compressibility, respiratory phasicity and response to augmentation.

Subclavian Vein: No evidence of thrombus. Normal compressibility,
respiratory phasicity and response to augmentation.

Axillary Vein: No evidence of thrombus. Normal compressibility,
respiratory phasicity and response to augmentation.

Cephalic Vein: No evidence of thrombus. Normal compressibility,
respiratory phasicity and response to augmentation.

Basilic Vein: No evidence of thrombus. Normal compressibility,
respiratory phasicity and response to augmentation.

Brachial Veins: No evidence of thrombus. Normal compressibility,
respiratory phasicity and response to augmentation.

Radial Veins: No evidence of thrombus. Normal compressibility,
respiratory phasicity and response to augmentation.

Ulnar Veins: No evidence of thrombus. Normal compressibility,
respiratory phasicity and response to augmentation.

Venous Reflux:  None visualized.

Other Findings:  None visualized.
IMPRESSION: No evidence of DVT within the left upper extremity.

## 2020-08-02 ENCOUNTER — Other Ambulatory Visit: Payer: Self-pay | Admitting: Internal Medicine

## 2020-08-02 DIAGNOSIS — Z1231 Encounter for screening mammogram for malignant neoplasm of breast: Secondary | ICD-10-CM

## 2020-10-19 ENCOUNTER — Other Ambulatory Visit: Payer: Self-pay | Admitting: Orthopedic Surgery

## 2020-10-19 ENCOUNTER — Other Ambulatory Visit (HOSPITAL_COMMUNITY): Payer: Self-pay | Admitting: Orthopedic Surgery

## 2020-10-19 DIAGNOSIS — M25562 Pain in left knee: Secondary | ICD-10-CM

## 2020-10-19 DIAGNOSIS — M1712 Unilateral primary osteoarthritis, left knee: Secondary | ICD-10-CM

## 2020-10-29 ENCOUNTER — Ambulatory Visit
Admission: RE | Admit: 2020-10-29 | Discharge: 2020-10-29 | Disposition: A | Discharge status: Home / Self Care | Payer: 59 | Source: Ambulatory Visit | Attending: Orthopedic Surgery | Admitting: Orthopedic Surgery

## 2020-10-29 ENCOUNTER — Other Ambulatory Visit: Payer: Self-pay

## 2020-10-29 DIAGNOSIS — M1712 Unilateral primary osteoarthritis, left knee: Secondary | ICD-10-CM

## 2020-10-29 DIAGNOSIS — M25562 Pain in left knee: Secondary | ICD-10-CM | POA: Insufficient documentation

## 2020-11-03 ENCOUNTER — Other Ambulatory Visit: Payer: Self-pay | Admitting: Orthopedic Surgery

## 2020-11-12 ENCOUNTER — Other Ambulatory Visit: Payer: Self-pay

## 2020-11-12 ENCOUNTER — Encounter
Admission: RE | Admit: 2020-11-12 | Discharge: 2020-11-12 | Disposition: A | Discharge status: Home / Self Care | Payer: 59 | Source: Ambulatory Visit | Attending: Orthopedic Surgery | Admitting: Orthopedic Surgery

## 2020-11-12 DIAGNOSIS — Z01810 Encounter for preprocedural cardiovascular examination: Secondary | ICD-10-CM | POA: Diagnosis present

## 2020-11-12 HISTORY — DX: Ulcerative colitis, unspecified, without complications: K51.90

## 2020-11-12 HISTORY — DX: Unspecified asthma, uncomplicated: J45.909

## 2020-11-12 HISTORY — DX: Other complications of anesthesia, initial encounter: T88.59XA

## 2020-11-12 HISTORY — DX: Fatty (change of) liver, not elsewhere classified: K76.0

## 2020-11-12 NOTE — Patient Instructions (Signed)
Your procedure is scheduled on:11-18-20 Thursday Report to the Registration Desk on the 1st floor of the [facility].Then proceed to the 2nd floor Surgery Desk in the Medical [facility] To find out your arrival time, please call [phone] between 1PM - 3PM on:11-17-20 Wednesday  REMEMBER: Instructions that are not followed completely may result in serious medical risk, up to and including death; or upon the discretion of your surgeon and anesthesiologist your surgery may need to be rescheduled.  Do not eat food after midnight the night before surgery.  No gum chewing, lozengers or hard candies.  You may however, drink Water up to 2 hours before you are scheduled to arrive for your surgery. Do not drink anything within 2 hours of your scheduled arrival time.  Type 1 and Type 2 diabetics should only drink water.  TAKE THESE MEDICATIONS THE MORNING OF SURGERY WITH A SIP OF WATER: -Lipitor (Atorvastatin)  Stop Metformin 2 days prior to surgery-Last dose on 11-15-20 Monday  One week prior to surgery: Stop Anti-inflammatories (NSAIDS) such as Advil, Aleve, Ibuprofen, Motrin, Naproxen, Naprosyn and Aspirin based products such as Excedrin, Goodys Powder, BC Powder.You may however, take Tylenol if needed for pain up until the day of surgery.  Stop ANY OVER THE COUNTER supplements/vitamins NOW (11-12-20) until after surgery.  No Alcohol for 24 hours before or after surgery.  No Smoking including e-cigarettes for 24 hours prior to surgery.  No chewable tobacco products for at least 6 hours prior to surgery.  No nicotine patches on the day of surgery.  Do not use any "recreational" drugs for at least a week prior to your surgery.  Please be advised that the combination of cocaine and anesthesia may have negative outcomes, up to and including death. If you test positive for cocaine, your surgery will be cancelled.  On the morning of surgery brush your teeth with toothpaste and water, you may rinse  your mouth with mouthwash if you wish. Do not swallow any toothpaste or mouthwash.  Use CHG Soap as directed on instruction sheet.  Do not wear jewelry, make-up, hairpins, clips or nail polish.  Do not wear lotions, powders, or perfumes.   Do not shave body from the neck down 48 hours prior to surgery just in case you cut yourself which could leave a site for infection.  Also, freshly shaved skin may become irritated if using the CHG soap.  Contact lenses, hearing aids and dentures may not be worn into surgery.  Do not bring valuables to the hospital. [facility] is not responsible for any missing/lost belongings or valuables.   Notify your doctor if there is any change in your medical condition (cold, fever, infection).  Wear comfortable clothing (specific to your surgery type) to the hospital.  After surgery, you can help prevent lung complications by doing breathing exercises.  Take deep breaths and cough every 1-2 hours. Your doctor may order a device called an Incentive Spirometer to help you take deep breaths. When coughing or sneezing, hold a pillow firmly against your incision with both hands. This is called "splinting." Doing this helps protect your incision. It also decreases belly discomfort.  If you are being admitted to the hospital overnight, leave your suitcase in the car. After surgery it may be brought to your room.  If you are being discharged the day of surgery, you will not be allowed to drive home. You will need a responsible adult (18 years or older) to drive you home and stay  with you that night.   If you are taking public transportation, you will need to have a responsible adult (18 years or older) with you. Please confirm with your physician that it is acceptable to use public transportation.   Please call the Pre-admissions Testing Dept. at [phone] if you have any questions about these instructions.  Surgery Visitation Policy:  Patients undergoing  a surgery or procedure may have one family member or support person with them as long as that person is not COVID-19 positive or experiencing its symptoms.  That person may remain in the waiting area during the procedure.  Inpatient Visitation:    Visiting hours are 7 a.m. to 8 p.m. Inpatients will be allowed two visitors daily. The visitors may change each day during the patient's stay. No visitors under the age of 88. Any visitor under the age of 61 must be accompanied by an adult. The visitor must pass COVID-19 screenings, use hand sanitizer when entering and exiting the patient's room and wear a mask at all times, including in the patient's room. Patients must also wear a mask when staff or their visitor are in the room. Masking is required regardless of vaccination status.

## 2020-11-18 ENCOUNTER — Ambulatory Visit: Payer: 59

## 2020-11-18 ENCOUNTER — Encounter: Payer: Self-pay | Admitting: Orthopedic Surgery

## 2020-11-18 ENCOUNTER — Ambulatory Visit
Admission: RE | Admit: 2020-11-18 | Discharge: 2020-11-18 | Disposition: A | Discharge status: Home / Self Care | Payer: 59 | Attending: Orthopedic Surgery | Admitting: Orthopedic Surgery

## 2020-11-18 ENCOUNTER — Encounter
Admission: RE | Disposition: A | Discharge status: Home / Self Care | Payer: Self-pay | Source: Home / Self Care | Attending: Orthopedic Surgery

## 2020-11-18 ENCOUNTER — Ambulatory Visit: Payer: 59 | Admitting: Anesthesiology

## 2020-11-18 ENCOUNTER — Other Ambulatory Visit: Payer: Self-pay

## 2020-11-18 DIAGNOSIS — M1812 Unilateral primary osteoarthritis of first carpometacarpal joint, left hand: Secondary | ICD-10-CM | POA: Diagnosis present

## 2020-11-18 HISTORY — PX: CARPOMETACARPAL (CMC) FUSION OF THUMB: SHX6290

## 2020-11-18 LAB — GLUCOSE, CAPILLARY: Glucose-Capillary: 176 mg/dL — ABNORMAL HIGH (ref 70–99)

## 2020-11-18 SURGERY — CARPOMETACARPAL (CMC) FUSION OF THUMB
Anesthesia: General | Site: Thumb | Laterality: Left

## 2020-11-18 MED ORDER — HYDROCODONE-ACETAMINOPHEN 5-325 MG PO TABS: 1.0000 | ORAL_TABLET | ORAL | Status: DC | PRN

## 2020-11-18 MED ORDER — HYDROCODONE-ACETAMINOPHEN 7.5-325 MG PO TABS
1.0000 | ORAL_TABLET | ORAL | Status: DC | PRN
  Filled 2020-11-18: qty 2

## 2020-11-18 MED ORDER — NEOMYCIN-POLYMYXIN B GU 40-200000 IR SOLN
Status: AC
  Filled 2020-11-18: qty 2

## 2020-11-18 MED ORDER — PROPOFOL 10 MG/ML IV BOLUS
INTRAVENOUS | Status: DC | PRN
  Administered 2020-11-18: 200 mg via INTRAVENOUS

## 2020-11-18 MED ORDER — SODIUM CHLORIDE 0.9 % IV SOLN: INTRAVENOUS | Status: DC

## 2020-11-18 MED ORDER — CEFAZOLIN SODIUM-DEXTROSE 2-4 GM/100ML-% IV SOLN
2.0000 g | INTRAVENOUS | Status: AC
  Administered 2020-11-18: 2 g via INTRAVENOUS

## 2020-11-18 MED ORDER — CEFAZOLIN SODIUM-DEXTROSE 2-4 GM/100ML-% IV SOLN
INTRAVENOUS | Status: AC
  Filled 2020-11-18: qty 100

## 2020-11-18 MED ORDER — FENTANYL CITRATE (PF) 100 MCG/2ML IJ SOLN
INTRAMUSCULAR | Status: DC | PRN
  Administered 2020-11-18 (×2): 25 ug via INTRAVENOUS
  Administered 2020-11-18: 50 ug via INTRAVENOUS
  Administered 2020-11-18: 25 ug via INTRAVENOUS
  Administered 2020-11-18: 50 ug via INTRAVENOUS

## 2020-11-18 MED ORDER — FENTANYL CITRATE (PF) 100 MCG/2ML IJ SOLN
INTRAMUSCULAR | Status: AC
  Filled 2020-11-18: qty 2

## 2020-11-18 MED ORDER — FAMOTIDINE 20 MG PO TABS
ORAL_TABLET | ORAL | Status: AC
  Filled 2020-11-18: qty 1

## 2020-11-18 MED ORDER — ONDANSETRON HCL 4 MG PO TABS: 4.0000 mg | ORAL_TABLET | Freq: Four times a day (QID) | ORAL | Status: DC | PRN

## 2020-11-18 MED ORDER — SODIUM CHLORIDE 0.9 % IR SOLN
Status: DC | PRN
  Administered 2020-11-18: 502 mL

## 2020-11-18 MED ORDER — BUPIVACAINE HCL (PF) 0.5 % IJ SOLN
INTRAMUSCULAR | Status: AC
  Filled 2020-11-18: qty 30

## 2020-11-18 MED ORDER — GELATIN ABSORBABLE 12-7 MM EX MISC
CUTANEOUS | Status: DC | PRN
  Administered 2020-11-18: 1

## 2020-11-18 MED ORDER — GELATIN ABSORBABLE 100 CM EX MISC
CUTANEOUS | Status: AC
  Filled 2020-11-18: qty 1

## 2020-11-18 MED ORDER — CHLORHEXIDINE GLUCONATE 0.12 % MT SOLN
OROMUCOSAL | Status: AC
  Filled 2020-11-18: qty 15

## 2020-11-18 MED ORDER — METOCLOPRAMIDE HCL 10 MG PO TABS: 5.0000 mg | ORAL_TABLET | Freq: Three times a day (TID) | ORAL | Status: DC | PRN

## 2020-11-18 MED ORDER — MIDAZOLAM HCL 2 MG/2ML IJ SOLN
INTRAMUSCULAR | Status: DC | PRN
  Administered 2020-11-18: 2 mg via INTRAVENOUS

## 2020-11-18 MED ORDER — ONDANSETRON HCL 4 MG/2ML IJ SOLN
INTRAMUSCULAR | Status: DC | PRN
  Administered 2020-11-18: 4 mg via INTRAVENOUS

## 2020-11-18 MED ORDER — ORAL CARE MOUTH RINSE: 15.0000 mL | Freq: Once | OROMUCOSAL | Status: AC

## 2020-11-18 MED ORDER — MORPHINE SULFATE (PF) 2 MG/ML IV SOLN: 0.5000 mg | INTRAVENOUS | Status: DC | PRN

## 2020-11-18 MED ORDER — HYDROCODONE-ACETAMINOPHEN 5-325 MG PO TABS: 1.0000 | ORAL_TABLET | ORAL | 0 refills | Status: DC | PRN

## 2020-11-18 MED ORDER — FENTANYL CITRATE (PF) 100 MCG/2ML IJ SOLN: 25.0000 ug | INTRAMUSCULAR | Status: DC | PRN

## 2020-11-18 MED ORDER — LIDOCAINE HCL (CARDIAC) PF 100 MG/5ML IV SOSY
PREFILLED_SYRINGE | INTRAVENOUS | Status: DC | PRN
  Administered 2020-11-18: 100 mg via INTRAVENOUS

## 2020-11-18 MED ORDER — DEXAMETHASONE SODIUM PHOSPHATE 10 MG/ML IJ SOLN
INTRAMUSCULAR | Status: DC | PRN
  Administered 2020-11-18: 5 mg via INTRAVENOUS

## 2020-11-18 MED ORDER — PROMETHAZINE HCL 25 MG/ML IJ SOLN: 6.2500 mg | INTRAMUSCULAR | Status: DC | PRN

## 2020-11-18 MED ORDER — CHLORHEXIDINE GLUCONATE 0.12 % MT SOLN
15.0000 mL | Freq: Once | OROMUCOSAL | Status: AC
  Administered 2020-11-18: 15 mL via OROMUCOSAL

## 2020-11-18 MED ORDER — MIDAZOLAM HCL 2 MG/2ML IJ SOLN
INTRAMUSCULAR | Status: AC
  Filled 2020-11-18: qty 2

## 2020-11-18 MED ORDER — PROPOFOL 10 MG/ML IV BOLUS
INTRAVENOUS | Status: AC
  Filled 2020-11-18: qty 20

## 2020-11-18 MED ORDER — METOCLOPRAMIDE HCL 5 MG/ML IJ SOLN: 5.0000 mg | Freq: Three times a day (TID) | INTRAMUSCULAR | Status: DC | PRN

## 2020-11-18 MED ORDER — FAMOTIDINE 20 MG PO TABS
20.0000 mg | ORAL_TABLET | Freq: Once | ORAL | Status: AC
  Administered 2020-11-18: 20 mg via ORAL

## 2020-11-18 MED ORDER — ONDANSETRON HCL 4 MG/2ML IJ SOLN: 4.0000 mg | Freq: Four times a day (QID) | INTRAMUSCULAR | Status: DC | PRN

## 2020-11-18 MED ORDER — BUPIVACAINE HCL (PF) 0.5 % IJ SOLN
INTRAMUSCULAR | Status: DC | PRN
  Administered 2020-11-18: 20 mL

## 2020-11-18 MED ORDER — ACETAMINOPHEN 325 MG PO TABS: 325.0000 mg | ORAL_TABLET | Freq: Four times a day (QID) | ORAL | Status: DC | PRN

## 2020-11-18 SURGICAL SUPPLY — 37 items
APL PRP STRL LF DISP 70% ISPRP (MISCELLANEOUS) ×1
BLADE OSC/SAGITTAL 5.5X25 (BLADE) ×2 IMPLANT
BNDG CMPR STD VLCR NS LF 5.8X3 (GAUZE/BANDAGES/DRESSINGS) ×1
BNDG CONFORM 3 STRL LF (GAUZE/BANDAGES/DRESSINGS) ×2 IMPLANT
BNDG ELASTIC 3X5.8 VLCR NS LF (GAUZE/BANDAGES/DRESSINGS) ×2 IMPLANT
CAST PADDING 3X4FT ST 30246 (SOFTGOODS) ×1
CHLORAPREP W/TINT 26 (MISCELLANEOUS) ×2 IMPLANT
CUFF TOURN SGL QUICK 18X4 (TOURNIQUET CUFF) IMPLANT
DRAPE FLUOR MINI C-ARM 54X84 (DRAPES) ×2 IMPLANT
ELECT CAUTERY BLADE 6.4 (BLADE) ×2 IMPLANT
GAUZE 4X4 16PLY ~~LOC~~+RFID DBL (SPONGE) ×2 IMPLANT
GAUZE SPONGE 4X4 12PLY STRL (GAUZE/BANDAGES/DRESSINGS) ×2 IMPLANT
GAUZE XEROFORM 1X8 LF (GAUZE/BANDAGES/DRESSINGS) ×2 IMPLANT
GLOVE SURG SYN 9.0  PF PI (GLOVE) ×1
GLOVE SURG SYN 9.0 PF PI (GLOVE) ×1 IMPLANT
GOWN SRG 2XL LVL 4 RGLN SLV (GOWNS) ×1 IMPLANT
GOWN STRL NON-REIN 2XL LVL4 (GOWNS) ×2
GOWN STRL REUS W/ TWL LRG LVL3 (GOWN DISPOSABLE) ×1 IMPLANT
GOWN STRL REUS W/TWL LRG LVL3 (GOWN DISPOSABLE) ×2
KIT TURNOVER KIT A (KITS) ×2 IMPLANT
MANIFOLD NEPTUNE II (INSTRUMENTS) ×2 IMPLANT
NS IRRIG 500ML POUR BTL (IV SOLUTION) ×2 IMPLANT
PACK EXTREMITY ARMC (MISCELLANEOUS) ×2 IMPLANT
PAD CAST CTTN 3X4 STRL (SOFTGOODS) ×1 IMPLANT
PADDING CAST COTTON 3X4 STRL (SOFTGOODS) ×1
SCALPEL PROTECTED #15 DISP (BLADE) ×4 IMPLANT
SPLINT CAST 1 STEP 3X12 (MISCELLANEOUS) ×2 IMPLANT
SPLINT WRIST M LT TX990308 (SOFTGOODS) ×2 IMPLANT
SUT ETHILON 4-0 (SUTURE) ×2
SUT ETHILON 4-0 FS2 18XMFL BLK (SUTURE) ×1
SUT VIC AB 0 CT2 27 (SUTURE) ×4 IMPLANT
SUT VIC AB 3-0 SH 27 (SUTURE) ×2
SUT VIC AB 3-0 SH 27X BRD (SUTURE) ×1 IMPLANT
SUTURE ETHLN 4-0 FS2 18XMF BLK (SUTURE) ×1 IMPLANT
SYSTEM IMPLANT TIGHTROPE MINI (Anchor) ×2 IMPLANT
WATER STERILE IRR 500ML POUR (IV SOLUTION) ×2 IMPLANT
WIRE Z .062 C-WIRE SPADE TIP (WIRE) ×4 IMPLANT

## 2020-11-18 NOTE — Anesthesia Preprocedure Evaluation (Signed)
Anesthesia Evaluation  Patient identified by MRN, date of birth, ID band Patient awake    Reviewed: Allergy & Precautions, H&P , NPO status , Patient's Chart, lab work & pertinent test results, reviewed documented beta blocker date and time   History of Anesthesia Complications Negative for: history of anesthetic complications  Airway Mallampati: III  TM Distance: <3 FB Neck ROM: full  Mouth opening: Limited Mouth Opening  Dental  (+) Poor Dentition, Dental Advidsory Given, Loose, Missing   Pulmonary neg shortness of breath, asthma , neg sleep apnea, neg recent URI,    Pulmonary exam normal breath sounds clear to auscultation       Cardiovascular Exercise Tolerance: Good hypertension, (-) angina(-) Past MI and (-) Cardiac Stents Normal cardiovascular exam(-) dysrhythmias (-) Valvular Problems/Murmurs Rhythm:regular Rate:Normal     Neuro/Psych negative neurological ROS  negative psych ROS   GI/Hepatic neg GERD  ,NAFLD Ulcerative colitis   Endo/Other  diabetes, Type 2, Oral Hypoglycemic Agents  Renal/GU negative Renal ROS  negative genitourinary   Musculoskeletal   Abdominal   Peds  Hematology negative hematology ROS (+)   Anesthesia Other Findings Past Medical History: No date: Asthma     Comment:  changed states and has had no issues with asthma No date: Complication of anesthesia     Comment:  woke up during 1st colonoscopy No date: Diabetes mellitus without complication (HCC)     Comment:  Type 2 No date: Fatty liver No date: Hypertension No date: Ulcerative colitis (HCC)   Reproductive/Obstetrics negative OB ROS                             Anesthesia Physical Anesthesia Plan  ASA: 3  Anesthesia Plan: General   Post-op Pain Management:    Induction: Intravenous  PONV Risk Score and Plan: 3 and Ondansetron, Dexamethasone, Midazolam and Treatment may vary due to age or  medical condition  Airway Management Planned: LMA  Additional Equipment:   Intra-op Plan:   Post-operative Plan: Extubation in OR  Informed Consent: I have reviewed the patients History and Physical, chart, labs and discussed the procedure including the risks, benefits and alternatives for the proposed anesthesia with the patient or authorized representative who has indicated his/her understanding and acceptance.     Dental Advisory Given  Plan Discussed with: Anesthesiologist, CRNA and Surgeon  Anesthesia Plan Comments:         Anesthesia Quick Evaluation

## 2020-11-18 NOTE — Discharge Instructions (Addendum)
Keep arm elevated through the weekend Ice to the back of the hand and wrist should help with some pain Pain medicine as directed Work fingers but do not try to move thumb Call office if you are having problems. AMBULATORY SURGERY  DISCHARGE INSTRUCTIONS   The drugs that you were given will stay in your system until tomorrow so for the next 24 hours you should not:  Drive an automobile Make any legal decisions Drink any alcoholic beverage   You may resume regular meals tomorrow.  Today it is better to start with liquids and gradually work up to solid foods.  You may eat anything you prefer, but it is better to start with liquids, then soup and crackers, and gradually work up to solid foods.   Please notify your doctor immediately if you have any unusual bleeding, trouble breathing, redness and pain at the surgery site, drainage, fever, or pain not relieved by medication.     Your post-operative visit with Dr.                                       is: Date:                        Time:    Please call to schedule your post-operative visit.  Additional Instructions:

## 2020-11-18 NOTE — Transfer of Care (Signed)
Immediate Anesthesia Transfer of Care Note  Patient: Connie Salazar  Procedure(s) Performed: CARPOMETACARPAL (CMC) FUSION OF THUMB (Left: Thumb)  Patient Location: PACU  Anesthesia Type:General  Level of Consciousness: awake, alert  and oriented  Airway & Oxygen Therapy: Patient Spontanous Breathing and Patient connected to nasal cannula oxygen  Post-op Assessment: Report given to RN and Post -op Vital signs reviewed and stable  Post vital signs: Reviewed and stable  Last Vitals:  Vitals Value Taken Time  BP 110/63 11/18/20 1431  Temp    Pulse 72 11/18/20 1435  Resp 24 11/18/20 1435  SpO2 100 % 11/18/20 1435  Vitals shown include unvalidated device data.  Last Pain:  Vitals:   11/18/20 1111  TempSrc: Tympanic  PainSc: 7       Patients Stated Pain Goal: 0 (11/18/20 1111)  Complications: No notable events documented.

## 2020-11-18 NOTE — Op Note (Signed)
11/18/2020  2:33 PM  PATIENT:  Connie Salazar  60 y.o. female  PRE-OPERATIVE DIAGNOSIS:  Primary osteoarthritis of first carpometacarpal joint of left hand M18.12  POST-OPERATIVE DIAGNOSIS:  Primary osteoarthritis of first carpometacarpal joint of left hand   PROCEDURE: Left thumb CMC arthroplasty  SURGEON: Leitha Schuller, MD  ASSISTANTS: None  ANESTHESIA:   general  EBL:  Total I/O In: 800 [I.V.:700; IV Piggyback:100] Out: 10 [Blood:10]  BLOOD ADMINISTERED:none  DRAINS: none   LOCAL MEDICATIONS USED:  MARCAINE     SPECIMEN:  No Specimen  DISPOSITION OF SPECIMEN:  N/A  COUNTS:  YES  TOURNIQUET:  * Missing tourniquet times found for documented tourniquets in log: [id number] *  IMPLANTS: Mini tight rope x1  DICTATION: .Dragon Dictation   Patient was brought to the operating room and after adequate general anesthesia was obtained the left arm was prepped and draped in usual sterile fashion with a tourniquet by the upper arm.  Appropriate patient identification and timeout procedure were completed and tourniquet raised to 250 mmHg.   An approximately 4 cm longitudinal incision was made centered over the radial aspect of the thumb CMC joint. The incision was carried down through the subcutaneous cutaneous tissues with care taken to identify and protect the local neurovascular structures. Several small veins were cauterized withelectrocautery. A longitudinal incision was made through the capsular tissues over the dorsal aspect of the thumb CMC joint. Subperiosteal dissection was carried out to expose the trapezium dorsally and volarly.  Using the corkscrew was used to help mobilize the trapezium and it was removed with rongeurs . The bone was then removed piecemeal using rongeurs and further dissection. he base of the thumb metacarpal was prepared by exposing its radial base.   The Arthrex 1.1 mm guidewire was drilled up through the proximal end of the thumb metacarpal beginning at the  dorsoradial surface and advancing into the volar radial aspect of the distalndex metacarpal shaft. The adequacy of pin position was verified using FluoroScan imaging in AP, lateral, and oblique projections and found to be excellent.   An approximately 1.5 cm incision was made over the dorsal aspect of the proximal end of the index metacarpal. The incision was carried down through the subcutaneous tissues with care taken to avoid damaging the extensor tendon. The interosseous muscle was dissected away from the dorso-ulnar aspect of the proximal index metacarpal shaft before the guidewire was advanced through the final cortex into this wound. The suture was passed through the nitinol wire loop and pulled across the thumb and index metacarpals, seating the mini Endobutton against the proximal end of the thumb metacarpal. The suture was cut and each and passed through the second mini Endobutton. The Endobutton was advanced to rest against the dorsal ulnar aspect of the proximal index metacarpal shaft before it was tied securely with appropriate tension to stabilize the base of the thumb metacarpal against the base of the index metacarpal. Again, the adequacy of Endobutton position and metacarpal reduction was verified fluoroscopically in AP, lateral, and oblique projections and found to be excellent. The thumb was then placed through a range of motion. It was able to be abducted and adducted fully and remained distracted to gentle axial loading.   The wounds were copiously irrigated with sterile saline solution using bulb irrigation. The thumb CMC capsular tissues were reapproximated using 0 Vicryl interrupted sutures before the skin was closed using 3-0 Vicryl subcuticular interrupted sutures. .  Both incisions closed with 4-0 nylon in a  simple interrupted fashion.  A total of 20 cc of 0.5% plain Sensorcaine was injected in and around both incisions to help with postoperative analgesia. A sterile bulky dressing was  applied to the wounds before the patient was placed into a fiberglass thumb spica splint maintaining the thumb in the position of function. The patient was then awakened, extubated, and returned to the recovery room in satisfactory condition after tolerating the  PLAN OF CARE: Discharge to home after PACU  PATIENT DISPOSITION:  PACU - hemodynamically stable.

## 2020-11-18 NOTE — H&P (Signed)
Chief Complaint  Patient presents with   Left Hand - Pain    History of the Present Illness: Connie Salazar is a 60 y.o. female here today.   The patient presents for follow-up evaluation to discuss possible left hand surgery. She had x-rays obtained on 12/15/2019 that showed severe thumb CMC osteoarthritis. I saw her last 6 weeks ago, and I recommended at that time to try a brace. She was not too interested at that time to talk about [facility] arthroplasty. She also saw Rachelle Hora, PA 1.5 weeks ago for left knee pain.  The patient states the meloxicam did not help her pain. She then started having a burning sensation and pain around her wrist. She states the pain is constant. She has pain with holding her phone. She states she has a carpal tunnel brace, but it does not help significantly. She has some relief wearing the brace at night.   The patient is right-hand dominant.  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  I have reviewed past medical, surgical, social and family history, and allergies as documented in the EMR.  Past Medical History: Past Medical History:  Diagnosis Date   Diabetes mellitus type 2, uncomplicated (CMS-HCC)   Hyperlipidemia   Ulcerative colitis (CMS-HCC)   Past Surgical History: Past Surgical History:  Procedure Laterality Date   CHOLECYSTECTOMY   HYSTERECTOMY   Past Family History: Family History  Problem Relation Age of Onset   Breast cancer Mother   No Known Problems Father   No Known Problems Sister   Medications: Current Outpatient Medications Ordered in Epic  Medication Sig Dispense Refill   acetaminophen (TYLENOL) 500 MG tablet Take 1,000 mg by mouth 2 (two) times daily as needed for Pain   atorvastatin (LIPITOR) 40 MG tablet Take 1 tablet (40 mg total) by mouth once daily 90 tablet 1   blood glucose diagnostic test strip Use 1 each (1 strip total) 2 (two) times daily Use as instructed. 100 each 12    glipiZIDE (GLUCOTROL) 5 MG tablet Take 1 tablet (5 mg total) by mouth every morning before breakfast 30 tablet 11   lisinopriL (ZESTRIL) 5 MG tablet Take 1 tablet (5 mg total) by mouth once daily 90 tablet 1   metFORMIN (GLUCOPHAGE) 1000 MG tablet Take 1 tablet (1,000 mg total) by mouth 2 (two) times daily with meals 180 tablet 3   naproxen (NAPROSYN) 500 MG tablet Take 1 tablet (500 mg total) by mouth 2 (two) times daily with meals for 14 days 28 tablet 0   blood glucose meter kit Use as directed 1 each 0   lancing device with lancets kit Use 1 each 2 (two) times daily Use as instructed. 100 each 11   meloxicam (MOBIC) 7.5 MG tablet Take 1 tablet (7.5 mg total) by mouth once daily (Patient not taking: Reported on 10/27/2020) 30 tablet 1   No current Epic-ordered facility-administered medications on file.   Allergies: Allergies  Allergen Reactions   Asacol [Mesalamine] Abdominal Pain    Body mass index is 38.31 kg/m.  Review of Systems: A comprehensive 14 point ROS was performed, reviewed, and the pertinent orthopaedic findings are documented in the HPI.  Vitals:  10/27/20 0801  BP: 122/70    General Physical Examination:   General/Constitutional: No apparent distress: well-nourished and well developed. Eyes: Pupils equal, round with synchronous movement. Lungs: Clear to auscultation HEENT: Normal Vascular: No edema, swelling or tenderness, except as noted  in detailed exam. Cardiac: Heart rate and rhythm is regular. Integumentary: No impressive skin lesions present, except as noted in detailed exam. Neuro/Psych: Normal mood and affect, oriented to person, place, and time.  Musculoskeletal examination:  On exam, positive left thumb grind test. Positive Tinel sign on the left.  Radiographs:  No new imaging studies were obtained today.  Assessment: ICD-10-CM  1. Primary osteoarthritis of first carpometacarpal joint of left hand M18.12  2. Carpal tunnel syndrome of left  wrist G56.02   Plan:  The patient has clinical findings of severe left thumb CMC osteoarthritis.  We discussed the patient's treatment options. I recommend left thumb CMC arthroplasty. I explained the surgery and postoperative course in detail.  We will schedule the patient for surgery in the near future.  Surgical Risks:  The nature of the condition and the proposed procedure has been reviewed in detail with the patient. Surgical versus non-surgical options and prognosis for recovery have been reviewed and the inherent risks and benefits of each have been discussed including the risks of infection, bleeding, injury to nerves/blood vessels/tendons, incomplete relief of symptoms, persisting pain and/or stiffness, loss of function, complex regional pain syndrome, failure of the procedure, as appropriate.  Scribe Attestation: I, Dawn Royse, am acting as scribe for [employer], MD.   Electronically signed by Lauris Poag, MD at 10/28/2020 12:48 PM EDT   Reviewed  H+P. No changes noted.

## 2020-11-18 NOTE — Anesthesia Postprocedure Evaluation (Signed)
Anesthesia Post Note  Patient: Kit Brubacher  Procedure(s) Performed: CARPOMETACARPAL (CMC) FUSION OF THUMB (Left: Thumb)  Patient location during evaluation: PACU Anesthesia Type: General Level of consciousness: awake and alert Pain management: pain level controlled Vital Signs Assessment: post-procedure vital signs reviewed and stable Respiratory status: spontaneous breathing, nonlabored ventilation, respiratory function stable and patient connected to nasal cannula oxygen Cardiovascular status: blood pressure returned to baseline and stable Postop Assessment: no apparent nausea or vomiting Anesthetic complications: no   No notable events documented.   Last Vitals:  Vitals:   11/18/20 1516 11/18/20 1526  BP: 125/69 136/81  Pulse: 73 71  Resp: 17 15  Temp: (!) 36.1 C 36.9 C  SpO2: 96% 97%    Last Pain:  Vitals:   11/18/20 1526  TempSrc: Oral  PainSc: 0-No pain                 Lenard Simmer

## 2020-11-18 NOTE — Anesthesia Procedure Notes (Signed)
Procedure Name: LMA Insertion Date/Time: 11/18/2020 1:08 PM Performed by: Berniece Pap, CRNA Pre-anesthesia Checklist: Patient identified, Emergency Drugs available, Suction available and Patient being monitored Patient Re-evaluated:Patient Re-evaluated prior to induction Oxygen Delivery Method: Circle system utilized Preoxygenation: Pre-oxygenation with 100% oxygen Induction Type: IV induction Ventilation: Mask ventilation without difficulty LMA: LMA inserted LMA Size: 4.0 and 3.5 Tube type: Oral Number of attempts: 1 Placement Confirmation: positive ETCO2 and breath sounds checked- equal and bilateral Tube secured with: Tape Dental Injury: Teeth and Oropharynx as per pre-operative assessment

## 2020-11-19 ENCOUNTER — Encounter: Payer: Self-pay | Admitting: Orthopedic Surgery

## 2020-11-19 NOTE — Progress Notes (Signed)
Patient called back with complaints of a blue hand. Pt is S/P left thumb CMC arthroplasty. States she called  Dr. Rosita Kea office and was told Dr. Rosita Kea was not available and that the office would call back. Pt clearly frustrated. She c/o a blue hand, states she has sensation but hand is cold. Pt admits that she has been using Ice on her hand. I advised her to loosen up ace she stated she didn't no how. Pt dressing consists of splint and ace wrap. Advised patient to go to the emergency room. Also advised her when she's en route to the ED, call Dr. Rosita Kea office again and ask if another provider could assess her hand. Pt expressed frustration of accruing an ED charge.

## 2020-11-24 ENCOUNTER — Encounter: Payer: Self-pay | Admitting: Orthopedic Surgery

## 2020-12-13 ENCOUNTER — Encounter: Payer: Self-pay | Admitting: Occupational Therapy

## 2020-12-13 ENCOUNTER — Ambulatory Visit: Payer: 59 | Attending: Orthopedic Surgery | Admitting: Occupational Therapy

## 2020-12-13 DIAGNOSIS — M79642 Pain in left hand: Secondary | ICD-10-CM | POA: Diagnosis not present

## 2020-12-13 DIAGNOSIS — M25642 Stiffness of left hand, not elsewhere classified: Secondary | ICD-10-CM | POA: Diagnosis present

## 2020-12-13 DIAGNOSIS — M6281 Muscle weakness (generalized): Secondary | ICD-10-CM

## 2020-12-13 DIAGNOSIS — R600 Localized edema: Secondary | ICD-10-CM | POA: Diagnosis present

## 2020-12-13 NOTE — Therapy (Signed)
[place] [facility] [address] [place], [address] Phone: [phone]   Fax:  [phone]  Occupational Therapy Evaluation  Patient Details  Name: Connie Salazar MRN: [id number] Date of Birth: [birth year] Referring Provider (OT): Dr Rosita Kea   Encounter Date: 12/13/2020   OT End of Session - 12/13/20 0947     Visit Number 1    Number of Visits 12    Date for OT Re-Evaluation 02/07/21    OT Start Time 0815    OT Stop Time 0901    OT Time Calculation (min) 46 min    Activity Tolerance Patient tolerated treatment well    Behavior During Therapy [facility] for tasks assessed/performed             Past Medical History:  Diagnosis Date   Asthma    changed states and has had no issues with asthma   Complication of anesthesia    woke up during 1st colonoscopy   Diabetes mellitus without complication (HCC)    Type 2   Fatty liver    Hypertension    Ulcerative colitis (HCC)     Past Surgical History:  Procedure Laterality Date   ABDOMINAL HYSTERECTOMY     CARPOMETACARPAL (CMC) FUSION OF THUMB Left 11/18/2020   Procedure: CARPOMETACARPAL [facility]) FUSION OF THUMB;  Surgeon: Kennedy Bucker, MD;  Location: [facility] ORS;  Service: Orthopedics;  Laterality: Left;   CHOLECYSTECTOMY     COLONOSCOPY      There were no vitals filed for this visit.   Subjective Assessment - 12/13/20 0900     Subjective  Doing okay -but I cannot sleep with the splint and I keep it of most of the time off - work on computer -but use mostly my fingers - split not feeling good    Pertinent History Connie Salazar is a 60 y.o. female who was seen by Ortho on 12/02/20 - post left thumb CMC arthroplasty by Dr. Kennedy Bucker on 11/18/2020. Patient is doing well. Pain and swelling improving. Not taking much pain medicine at this time. She is wearing a thumb brace. No warmth erythema or drainage. Still little bit of numbness along the tip of the thumb but this is  improving  - refer to OT    Patient Stated Goals I want to be able to bake and do sewing with my hand - and thumb- get the motion and strength better    Currently in Pain? Yes    Pain Score 5     Pain Location --   thumb   Pain Orientation Left    Pain Descriptors / Indicators Aching;Tender;Tightness    Pain Type Surgical pain    Pain Onset 1 to 4 weeks ago    Pain Frequency Constant               [facility] OT Assessment - 12/13/20 0001       Assessment   Medical Diagnosis L thymb arthroplasty    Referring Provider (OT) Dr Rosita Kea    Onset Date/Surgical Date 11/18/20    Hand Dominance Right    Next MD Visit nov 4th      Home  Environment   Lives With Alone      Prior Function   Vocation Full time employment    Leisure computer , sewing, baking make bread ,      AROM   Right Wrist Extension 70 Degrees    Right Wrist Flexion 90 Degrees  Right Wrist Radial Deviation 20 Degrees    Right Wrist Ulnar Deviation 32 Degrees    Left Wrist Extension 55 Degrees    Left Wrist Flexion 55 Degrees    Left Wrist Radial Deviation 20 Degrees    Left Wrist Ulnar Deviation 28 Degrees      Left Hand AROM   L Thumb MCP 0-60 35 Degrees    L Thumb IP 0-80 55 Degrees    L Thumb Radial ADduction/ABduction 0-55 50    L Thumb Palmar ADduction/ABduction 0-45 60    L Thumb Opposition to Index --   Opposition to 4th -   L Index  MCP 0-90 70 Degrees    L Index PIP 0-100 95 Degrees    L Long  MCP 0-90 75 Degrees    L Long PIP 0-100 95 Degrees    L Ring  MCP 0-90 65 Degrees    L Ring PIP 0-100 100 Degrees    L Little  MCP 0-90 65 Degrees    L Little PIP 0-100 95 Degrees                  Contrast done with pt -and sterri strip add to incision again - 2 removed -scabs still in place Scar massage over dorsal hand and over sterri strips  Isotoner glove under thumb spica - as needed  And tendon glides Wrist AROM flexion ,ext , RD, UD  And thumb circular, thumb PA and RA - opposition   pick up 2 cm small object Keep pain under 1-2/10 - slight pull or stretch  SLOW down HEP             OT Education - 12/13/20 0947     Education Details results of OT eval and HEP    Person(s) Educated Patient    Methods Explanation;Demonstration;Tactile cues;Verbal cues;Handout    Comprehension Verbal cues required;Returned demonstration;Verbalized understanding              OT Short Term Goals - 12/13/20 0954       OT SHORT TERM GOAL #1   Title Pt to be independent in HEP to decrease edema and pain with increase AROM in thumb , MC flexion and wrist    Baseline no knowledge - pain 5/10 and increase edema    Time 3    Period Weeks    Status New    Target Date 01/03/21               OT Long Term Goals - 12/13/20 0955       OT LONG TERM GOAL #1   Title Pt's AROM in  L hand MC and composite flexion of digits and wrist AROM increase to WNL to use hand more than 50% in ADL's without increase symptoms    Baseline MC's decrease and composite flexion - 60's to 70's , wrist AROM decrease in all planes except RD - pain 5/10    Time 4    Period Weeks    Status New    Target Date 01/10/21      OT LONG TERM GOAL #2   Title L thumb AROM increase to [facility] to type on computer, pick up cup and do buttons    Baseline L thumb AROM impaired in all planes and joints and ont using thumb - pain rest 5/10    Time 5    Period Weeks    Status New    Target Date 01/17/21  OT LONG TERM GOAL #3   Title L grip and prehension strength increase to more than 60% compare to R hand to pull ,push door, carry more than 5lbs and hold plate without increase symptoms    Baseline 3 1/2 wks - pain rest 5/10 - decrease AROM in all planes for digits, thumb and wrist -    Time 8    Period Weeks    Status New    Target Date 02/07/21                   Plan - 12/13/20 0950     Clinical Impression Statement Pt present at OT eval s/p L thumb CMC arthroplasty on 11/18/20 -pt 3 1/2  wks today - present with strerri strips still in place on thumb incision -  increase pain of 5/10 at rest - not using her prefab thumb spica as she should - and increase edema, decrease AROM in thumb,  MC and composite flexion of digits , and wrist AROM - with decrease strength limiting her functional use in ADL's and IADL's    OT Occupational Profile and History Problem Focused Assessment - Including review of records relating to presenting problem    Occupational performance deficits (Please refer to evaluation for details): ADL's;IADL's;Rest and Sleep;Work;Play;Social Participation;Leisure    Body Structure / Function / Physical Skills ADL;Decreased knowledge of precautions;Coordination;Flexibility;Sensation;IADL;ROM;Edema;Dexterity;Pain;Strength    Rehab Potential Good    Clinical Decision Making Limited treatment options, no task modification necessary    Comorbidities Affecting Occupational Performance: None    Modification or Assistance to Complete Evaluation  No modification of tasks or assist necessary to complete eval    OT Frequency 2x / week    OT Duration 8 weeks    OT Treatment/Interventions Self-care/ADL training;DME and/or AE instruction;Fluidtherapy;Splinting;Contrast Bath;Therapeutic exercise;Scar mobilization;Paraffin;Manual Therapy;Patient/family education;Passive range of motion    Consulted and Agree with Plan of Care Patient             Patient will benefit from skilled therapeutic intervention in order to improve the following deficits and impairments:   Body Structure / Function / Physical Skills: ADL, Decreased knowledge of precautions, Coordination, Flexibility, Sensation, IADL, ROM, Edema, Dexterity, Pain, Strength       Visit Diagnosis: Pain in left hand - Plan: Ot plan of care cert/re-cert  Stiffness of left hand, not elsewhere classified - Plan: Ot plan of care cert/re-cert  Localized edema - Plan: Ot plan of care cert/re-cert  Muscle weakness  (generalized) - Plan: Ot plan of care cert/re-cert    Problem List Patient Active Problem List   Diagnosis Date Noted   Lymphedema 01/14/2019   Essential hypertension 10/10/2018   Elevated blood pressure reading 01/07/2018   Elevated liver enzymes 01/07/2018   History of ulcerative colitis 01/07/2018   Pure hypercholesterolemia 09/28/2017   Type 2 diabetes mellitus without complication, without long-term current use of insulin (HCC) 09/28/2017   Acute upper respiratory infection 03/09/2010   Radial styloid tenosynovitis 02/15/2010   Sprain of MCL (medial collateral ligament) of knee 12/13/2009   Allergy 06/10/2009   Ulcerative colitis (HCC) 06/10/2009   Abdominal pain 08/26/2008   Pain in limb 06/17/2008   Dysmenorrhea 01/17/2008   Bronchitis, not specified as acute or chronic 01/28/2007    Oletta Cohn, OTR/L,CLT 12/13/2020, 10:00 AM  [place] [facility] [address] [place], [address] Phone: [phone]   Fax:  [phone]  Name: Connie Salazar MRN: [id number] Date of  Birth: [birth year]

## 2020-12-20 ENCOUNTER — Ambulatory Visit: Payer: 59 | Admitting: Occupational Therapy

## 2020-12-20 DIAGNOSIS — M25642 Stiffness of left hand, not elsewhere classified: Secondary | ICD-10-CM

## 2020-12-20 DIAGNOSIS — M79642 Pain in left hand: Secondary | ICD-10-CM | POA: Diagnosis not present

## 2020-12-20 DIAGNOSIS — R600 Localized edema: Secondary | ICD-10-CM

## 2020-12-20 DIAGNOSIS — M6281 Muscle weakness (generalized): Secondary | ICD-10-CM

## 2020-12-20 NOTE — Therapy (Signed)
[facility] [address], [place], [address] Phone: [phone]   Fax:  [phone]  Occupational Therapy Treatment  Patient Details  Name: Connie Salazar MRN: [id number] Date of Birth: [birth year] Referring Provider (OT): Dr Rosita Kea   Encounter Date: 12/20/2020   OT End of Session - 12/20/20 1855     Visit Number 2    Number of Visits 12    Date for OT Re-Evaluation 02/07/21    OT Start Time 1600    OT Stop Time 1648    OT Time Calculation (min) 48 min    Activity Tolerance Patient tolerated treatment well    Behavior During Therapy [facility] for tasks assessed/performed             Past Medical History:  Diagnosis Date   Asthma    changed states and has had no issues with asthma   Complication of anesthesia    woke up during 1st colonoscopy   Diabetes mellitus without complication (HCC)    Type 2   Fatty liver    Hypertension    Ulcerative colitis (HCC)     Past Surgical History:  Procedure Laterality Date   ABDOMINAL HYSTERECTOMY     CARPOMETACARPAL (CMC) FUSION OF THUMB Left 11/18/2020   Procedure: CARPOMETACARPAL [facility]) FUSION OF THUMB;  Surgeon: Kennedy Bucker, MD;  Location: [facility] ORS;  Service: Orthopedics;  Laterality: Left;   CHOLECYSTECTOMY     COLONOSCOPY      There were no vitals filed for this visit.   Subjective Assessment - 12/20/20 1849     Subjective  Doing little better - but that splint is not at all comfortable and still some swelling but the pain is better- and incision close    Pertinent History Connie Salazar is a 60 y.o. female who was seen by Ortho on 12/02/20 - post left thumb CMC arthroplasty by Dr. Kennedy Bucker on 11/18/2020. Patient is doing well. Pain and swelling improving. Not taking much pain medicine at this time. She is wearing a thumb brace. No warmth erythema or drainage. Still little bit of numbness along the tip of the thumb but this is improving  - refer to OT     Patient Stated Goals I want to be able to bake and do sewing with my hand - and thumb- get the motion and strength better    Currently in Pain? Yes    Pain Score 2     Pain Location Hand    Pain Orientation Left    Pain Descriptors / Indicators Aching;Tender    Pain Type Surgical pain    Pain Onset 1 to 4 weeks ago    Pain Frequency Intermittent                [facility] OT Assessment - 12/20/20 0001       AROM   Left Wrist Extension 62 Degrees    Left Wrist Flexion 62 Degrees    Left Wrist Radial Deviation 20 Degrees    Left Wrist Ulnar Deviation 32 Degrees      Left Hand AROM   L Thumb MCP 0-60 45 Degrees    L Thumb IP 0-80 65 Degrees    L Thumb Radial ADduction/ABduction 0-55 50    L Thumb Palmar ADduction/ABduction 0-45 60    L Thumb Opposition to Index --   opposition to side of 5th - pull   L Index  MCP 0-90 65 Degrees  L Index PIP 0-100 100 Degrees    L Long  MCP 0-90 85 Degrees    L Long PIP 0-100 100 Degrees    L Ring  MCP 0-90 90 Degrees    L Ring PIP 0-100 100 Degrees    L Little  MCP 0-90 90 Degrees    L Little PIP 0-100 100 Degrees                      OT Treatments/Exercises (OP) - 12/20/20 0001       LUE Fluidotherapy   Number Minutes Fluidotherapy 8 Minutes    LUE Fluidotherapy Location Hand;Wrist    Comments AROM for thumb and wrist in all planes             Contrast done during fluido this date  to decrease edema and prior to soft tissue mobs -and pt ed on scar massage and cica scar pad use nigh time    Isotoner glove under thumb spica - as needed  And tendon glides Add and review for pt AAROM for wrist flexion, ext,RD, UD pain free to do prior to Wrist AROM flexion ,ext , RD, UD  Showed increase to 70 degrees flexion , ext at wrist  Cont with  thumb circular, thumb PA and RA - opposition  pick up 2 cm small object Keep pain under 1-2/10 - slight pull or stretch       OT Education - 12/20/20 1855     Education Details  progress and changes to HEP    Person(s) Educated Patient    Methods Explanation;Demonstration;Tactile cues;Verbal cues;Handout    Comprehension Verbal cues required;Returned demonstration;Verbalized understanding              OT Short Term Goals - 12/13/20 0954       OT SHORT TERM GOAL #1   Title Pt to be independent in HEP to decrease edema and pain with increase AROM in thumb , MC flexion and wrist    Baseline no knowledge - pain 5/10 and increase edema    Time 3    Period Weeks    Status New    Target Date 01/03/21               OT Long Term Goals - 12/13/20 0955       OT LONG TERM GOAL #1   Title Pt's AROM in  L hand MC and composite flexion of digits and wrist AROM increase to WNL to use hand more than 50% in ADL's without increase symptoms    Baseline MC's decrease and composite flexion - 60's to 70's , wrist AROM decrease in all planes except RD - pain 5/10    Time 4    Period Weeks    Status New    Target Date 01/10/21      OT LONG TERM GOAL #2   Title L thumb AROM increase to [facility] to type on computer, pick up cup and do buttons    Baseline L thumb AROM impaired in all planes and joints and ont using thumb - pain rest 5/10    Time 5    Period Weeks    Status New    Target Date 01/17/21      OT LONG TERM GOAL #3   Title L grip and prehension strength increase to more than 60% compare to R hand to pull ,push door, carry more than 5lbs and hold plate without increase symptoms  Baseline 3 1/2 wks - pain rest 5/10 - decrease AROM in all planes for digits, thumb and wrist -    Time 8    Period Weeks    Status New    Target Date 02/07/21                   Plan - 12/20/20 1855     Clinical Impression Statement Pt present at OT eval s/p L thumb CMC arthroplasty on 11/18/20 -pt 4 1/2 wks  -  Pt with decrease pain compate to eval and increase AROM in thumb in all planes and wrist - cont to have edema over radial hand with some pain and stiffness - pt  fitted with CMC neoprene to wear during day - and can cont with isotoner glove under thumb spica at night time- ed on scar massage and cica scar pad for night time  - pt with cont increase edema, decrease AROM in thumb,  MC and composite flexion of digits , and wrist AROM - with decrease strength limiting her functional use in ADL's and IADL's    OT Occupational Profile and History Problem Focused Assessment - Including review of records relating to presenting problem    Occupational performance deficits (Please refer to evaluation for details): ADL's;IADL's;Rest and Sleep;Work;Play;Social Participation;Leisure    Body Structure / Function / Physical Skills ADL;Decreased knowledge of precautions;Coordination;Flexibility;Sensation;IADL;ROM;Edema;Dexterity;Pain;Strength    Rehab Potential Good    Clinical Decision Making Limited treatment options, no task modification necessary    Comorbidities Affecting Occupational Performance: None    Modification or Assistance to Complete Evaluation  No modification of tasks or assist necessary to complete eval    OT Frequency 2x / week    OT Duration 8 weeks    OT Treatment/Interventions Self-care/ADL training;DME and/or AE instruction;Fluidtherapy;Splinting;Contrast Bath;Therapeutic exercise;Scar mobilization;Paraffin;Manual Therapy;Patient/family education;Passive range of motion    Consulted and Agree with Plan of Care Patient             Patient will benefit from skilled therapeutic intervention in order to improve the following deficits and impairments:   Body Structure / Function / Physical Skills: ADL, Decreased knowledge of precautions, Coordination, Flexibility, Sensation, IADL, ROM, Edema, Dexterity, Pain, Strength       Visit Diagnosis: Stiffness of left hand, not elsewhere classified  Localized edema  Muscle weakness (generalized)  Pain in left hand    Problem List Patient Active Problem List   Diagnosis Date Noted   Lymphedema  01/14/2019   Essential hypertension 10/10/2018   Elevated blood pressure reading 01/07/2018   Elevated liver enzymes 01/07/2018   History of ulcerative colitis 01/07/2018   Pure hypercholesterolemia 09/28/2017   Type 2 diabetes mellitus without complication, without long-term current use of insulin (HCC) 09/28/2017   Acute upper respiratory infection 03/09/2010   Radial styloid tenosynovitis 02/15/2010   Sprain of MCL (medial collateral ligament) of knee 12/13/2009   Allergy 06/10/2009   Ulcerative colitis (HCC) 06/10/2009   Abdominal pain 08/26/2008   Pain in limb 06/17/2008   Dysmenorrhea 01/17/2008   Bronchitis, not specified as acute or chronic 01/28/2007    Oletta Cohn, OTR/L,CLT 12/20/2020, 6:59 PM  [facility] [address], [place], [address] Phone: [phone]   Fax:  [phone]  Name: Trevor Duty MRN: [id number] Date of Birth: November 06, 1960

## 2020-12-24 ENCOUNTER — Ambulatory Visit (INDEPENDENT_AMBULATORY_CARE_PROVIDER_SITE_OTHER): Payer: 59 | Admitting: Vascular Surgery

## 2020-12-24 ENCOUNTER — Other Ambulatory Visit: Payer: Self-pay

## 2020-12-24 VITALS — BP 139/82 | HR 69 | Ht 65.0 in | Wt 234.0 lb

## 2020-12-24 DIAGNOSIS — E119 Type 2 diabetes mellitus without complications: Secondary | ICD-10-CM

## 2020-12-24 DIAGNOSIS — I10 Essential (primary) hypertension: Secondary | ICD-10-CM

## 2020-12-24 DIAGNOSIS — M79602 Pain in left arm: Secondary | ICD-10-CM

## 2020-12-24 DIAGNOSIS — I890 Lymphedema, not elsewhere classified: Secondary | ICD-10-CM

## 2020-12-24 NOTE — Progress Notes (Signed)
MRN : [id number]  Connie Salazar is a 60 y.o. (1961-01-17) female who presents with chief complaint of  Chief Complaint  Patient presents with   Follow-up    1 yr no studies  .  History of Present Illness: Patient returns today in follow up of her left arm lymphedema.  About 4 to 5 weeks ago she underwent a significant left hand surgery and has been unable to wear her sleeve or use her lymphedema pump.  Prior to the surgery, those measures were controlling her swelling quite well.  She reports the swelling to have worsened since she has been unable to use them.  She sees her hand surgeon back next week and I told her as soon she can get back in the sleeve and start using the pump that should help her symptoms.  She is also seeing a therapist for her hand who also does lymphedema work and she is interested in manual massage which sounds very reasonable to me.  Current Outpatient Medications  Medication Sig Dispense Refill   acetaminophen (TYLENOL) 500 MG tablet Take 1,000 mg by mouth every 8 (eight) hours as needed for moderate pain.     Ascorbic Acid (VITAMIN C) 1000 MG tablet Take 1,000 mg by mouth as needed.     atorvastatin (LIPITOR) 40 MG tablet Take 40 mg by mouth every morning.     Cholecalciferol (VITAMIN D) 50 MCG (2000 UT) tablet Take 2,000 Units by mouth as needed.     glipiZIDE (GLUCOTROL) 5 MG tablet Take 5 mg by mouth daily before breakfast.     glipiZIDE (GLUCOTROL) 5 MG tablet Take by mouth.     HYDROcodone-acetaminophen (NORCO) 5-325 MG tablet Take 1-2 tablets by mouth every 4 (four) hours as needed for moderate pain (Maximum 8 per 24 hours). 30 tablet 0   lisinopril (ZESTRIL) 5 MG tablet Take 5 mg by mouth every morning.     metFORMIN (GLUCOPHAGE) 1000 MG tablet Take 1,000 mg by mouth 2 (two) times daily.     Zinc 30 MG TABS Take 30 mg by mouth as needed.     No current facility-administered medications for this visit.    Past Medical History:  Diagnosis Date   Asthma     changed states and has had no issues with asthma   Complication of anesthesia    woke up during 1st colonoscopy   Diabetes mellitus without complication (HCC)    Type 2   Fatty liver    Hypertension    Ulcerative colitis (HCC)     Past Surgical History:  Procedure Laterality Date   ABDOMINAL HYSTERECTOMY     CARPOMETACARPAL (CMC) FUSION OF THUMB Left 11/18/2020   Procedure: CARPOMETACARPAL [facility]) FUSION OF THUMB;  Surgeon: Kennedy Bucker, MD;  Location: [facility] ORS;  Service: Orthopedics;  Laterality: Left;   CHOLECYSTECTOMY     COLONOSCOPY       Social History   Tobacco Use   Smoking status: Never   Smokeless tobacco: Never  Vaping Use   Vaping Use: Never used  Substance Use Topics   Alcohol use: Yes    Alcohol/week: 1.0 standard drink    Types: 1 Glasses of wine per week    Comment: wine ocassionally   Drug use: Never      Family History  Problem Relation Age of Onset   Breast cancer Mother 27     Allergies  Allergen Reactions   Mesalamine Anxiety    Abdominal Pain  REVIEW OF SYSTEMS (Negative unless checked)  Constitutional: [] Weight loss  [] Fever  [] Chills Cardiac: [] Chest pain   [] Chest pressure   [] Palpitations   [] Shortness of breath when laying flat   [] Shortness of breath at rest   [] Shortness of breath with exertion. Vascular:  [] Pain in legs with walking   [] Pain in legs at rest   [] Pain in legs when laying flat   [] Claudication   [] Pain in feet when walking  [] Pain in feet at rest  [] Pain in feet when laying flat   [] History of DVT   [] Phlebitis   [] Swelling in legs   [] Varicose veins   [] Non-healing ulcers Pulmonary:   [] Uses home oxygen   [] Productive cough   [] Hemoptysis   [] Wheeze  [] COPD   [] Asthma Neurologic:  [] Dizziness  [] Blackouts   [] Seizures   [] History of stroke   [] History of TIA  [] Aphasia   [] Temporary blindness   [] Dysphagia   [] Weakness or numbness in arms   [] Weakness or numbness in legs Musculoskeletal:  [x] Arthritis   [] Joint  swelling   [] Joint pain   [] Low back pain Hematologic:  [] Easy bruising  [] Easy bleeding   [] Hypercoagulable state   [] Anemic   Gastrointestinal:  [] Blood in stool   [] Vomiting blood  [] Gastroesophageal reflux/heartburn   [] Abdominal pain Genitourinary:  [] Chronic kidney disease   [] Difficult urination  [] Frequent urination  [] Burning with urination   [] Hematuria Skin:  [] Rashes   [] Ulcers   [] Wounds Psychological:  [] History of anxiety   []  History of major depression.  Physical Examination  BP 139/82   Pulse 69   Ht 5\' 5"  (1.651 m)   Wt 234 lb (106.1 kg)   BMI 38.94 kg/m  Gen:  WD/WN, NAD Head: [place]/AT, No temporalis wasting. Ear/Nose/Throat: Hearing grossly intact, nares w/o erythema or drainage Eyes: Conjunctiva clear. Sclera non-icteric Neck: Supple.  Trachea midline Pulmonary:  Good air movement, no use of accessory muscles.  Cardiac: RRR, no JVD Vascular:  Vessel Right Left  Radial Palpable Palpable               Musculoskeletal: M/S 5/5 throughout.  No deformity or atrophy.  Mild left upper extremity edema. Neurologic: Sensation grossly intact in extremities.  Symmetrical.  Speech is fluent.  Psychiatric: Judgment intact, Mood & affect appropriate for pt's clinical situation. Dermatologic: No rashes or ulcers noted.  Left hand incision is well-healed      Labs Recent Results (from the past 2160 hour(s))  Glucose, capillary     Status: Abnormal   Collection Time: 11/18/20  2:34 PM  Result Value Ref Range   Glucose-Capillary 176 (H) 70 - 99 mg/dL    Comment: Glucose reference range applies only to samples taken after fasting for at least 8 hours.    Radiology No results found.  Assessment/Plan Type 2 diabetes mellitus without complication, without long-term current use of insulin (HCC) blood glucose control important in reducing the progression of atherosclerotic disease. Also, involved in wound healing. On appropriate medications.     Essential  hypertension blood pressure control important in reducing the progression of atherosclerotic disease. On appropriate oral medications.   Pain in limb Has recently been treated for tenosynovitis of the left hand with surgery which may have exacerbated her left arm symptoms.  Still having some pain from this.  Lymphedema Symptoms are a little worse currently after her recent left hand surgery.  Get back in her sleeve and lymphedema pump as soon as possible.  Return to clinic in  1 year.    Festus Barren, MD  12/24/2020 9:19 AM    This note was created with Dragon medical transcription system.  Any errors from dictation are purely unintentional

## 2020-12-24 NOTE — Assessment & Plan Note (Signed)
Symptoms are a little worse currently after her recent left hand surgery.  Get back in her sleeve and lymphedema pump as soon as possible.  Return to clinic in 1 year.

## 2020-12-27 ENCOUNTER — Ambulatory Visit: Payer: 59 | Admitting: Occupational Therapy

## 2020-12-27 DIAGNOSIS — M25642 Stiffness of left hand, not elsewhere classified: Secondary | ICD-10-CM

## 2020-12-27 DIAGNOSIS — M6281 Muscle weakness (generalized): Secondary | ICD-10-CM

## 2020-12-27 DIAGNOSIS — M79642 Pain in left hand: Secondary | ICD-10-CM

## 2020-12-27 DIAGNOSIS — R600 Localized edema: Secondary | ICD-10-CM

## 2020-12-27 NOTE — Therapy (Signed)
[facility] [address], [place], [address] Phone: [phone]   Fax:  [phone]  Occupational Therapy Treatment  Patient Details  Name: Linnet Urness MRN: [id number] Date of Birth: Nov 06, 1960 Referring Provider (OT): Dr Rudene Christians   Encounter Date: 12/27/2020   OT End of Session - 12/27/20 1707     Visit Number 3    Number of Visits 12    Date for OT Re-Evaluation 02/07/21    OT Start Time 1605    OT Stop Time 1700    OT Time Calculation (min) 55 min    Activity Tolerance Patient tolerated treatment well    Behavior During Therapy [facility] for tasks assessed/performed             Past Medical History:  Diagnosis Date   Asthma    changed states and has had no issues with asthma   Complication of anesthesia    woke up during 1st colonoscopy   Diabetes mellitus without complication ([place])    Type 2   Fatty liver    Hypertension    Ulcerative colitis ([place])     Past Surgical History:  Procedure Laterality Date   ABDOMINAL HYSTERECTOMY     CARPOMETACARPAL ([place]) FUSION OF THUMB Left 11/18/2020   Procedure: CARPOMETACARPAL [facility]) FUSION OF THUMB;  Surgeon: Hessie Knows, MD;  Location: [facility] ORS;  Service: Orthopedics;  Laterality: Left;   CHOLECYSTECTOMY     COLONOSCOPY      There were no vitals filed for this visit.   Subjective Assessment - 12/27/20 1704     Subjective  DOing okay- going most of day without my splint - glove I wear - still have swelling -did see Dr Lucky Cowboy for my lymphedema in L arm that is worse since surgery - he says to go into my sleeve and pump as soon as I can    Pertinent History ALMIRA MATTEY is a 60 y.o. female who was seen by Ortho on 12/02/20 - post left thumb CMC arthroplasty by Dr. Hessie Knows on 11/18/2020. Patient is doing well. Pain and swelling improving. Not taking much pain medicine at this time. She is wearing a thumb brace. No warmth erythema or drainage. Still little  bit of numbness along the tip of the thumb but this is improving  - refer to OT    Patient Stated Goals I want to be able to bake and do sewing with my hand - and thumb- get the motion and strength better    Currently in Pain? Yes    Pain Score 3     Pain Location Hand    Pain Orientation Left    Pain Descriptors / Indicators Aching;Tender    Pain Type Surgical pain    Pain Onset More than a month ago    Pain Frequency Intermittent                [facility] OT Assessment - 12/27/20 0001       AROM   Left Wrist Extension 70 Degrees    Left Wrist Flexion 70 Degrees      Strength   Right Hand Grip (lbs) 46    Right Hand Lateral Pinch 13 lbs    Right Hand 3 Point Pinch 13 lbs    Left Hand Grip (lbs) 24    Left Hand Lateral Pinch 3 lbs    Left Hand 3 Point Pinch 2 lbs  LYMPHEDEMA/ONCOLOGY QUESTIONNAIRE - 12/27/20 0001       Right Upper Extremity Lymphedema   15 cm Proximal to Olecranon Process 41 cm    10 cm Proximal to Olecranon Process 36.5 cm    Olecranon Process 28.5 cm    15 cm Proximal to Ulnar Styloid Process 28 cm    10 cm Proximal to Ulnar Styloid Process 25 cm    Just Proximal to Ulnar Styloid Process 16.5 cm      Left Upper Extremity Lymphedema   15 cm Proximal to Olecranon Process 43 cm    10 cm Proximal to Olecranon Process 40 cm    Olecranon Process 31 cm    15 cm Proximal to Ulnar Styloid Process 29.5 cm    10 cm Proximal to Ulnar Styloid Process 26 cm    Just Proximal to Ulnar Styloid Process 18 cm             COnt to make progress in AROM and use of L hand        OT Treatments/Exercises (OP) - 12/27/20 0001       LUE Fluidotherapy   Number Minutes Fluidotherapy 8 Minutes    LUE Fluidotherapy Location Hand;Wrist    Comments with ice inbetween at 3 min             Contrast done during fluido this date  to decrease edema and prior to soft tissue mobs -and pt ed on scar massage and cica scar pad use nigh time again Pt  cont to have lymphedema diagnosis in L UE - but cannot wear compression sleeve and pump- but her sleeve to large or stretch out - not providing any compression - pt to bring her other one in Thursday  Fitted with Tubigrip D hand to elbow , E from mid forearm to upper arm  And isotoner glove      Isotoner glove under thumb spica  And tendon glides  AAROM for wrist flexion, ext,RD, UD pain free t Review and add 1 lbs or 16oz hammer for wrist in all planes - 12 reps  2 x day  Cont with  thumb circular, thumb PA and RA - opposition   Light blue putty for gripping 12 reps  2 x day - rubber band for PA and RA of thumb  12 reps painfree Keep pain under 1-2/10 - slight pull or stretch       OT Education - 12/27/20 1707     Education Details progress and changes to HEP    Person(s) Educated Patient    Methods Explanation;Demonstration;Tactile cues;Verbal cues;Handout    Comprehension Verbal cues required;Returned demonstration;Verbalized understanding              OT Short Term Goals - 12/13/20 0954       OT SHORT TERM GOAL #1   Title Pt to be independent in HEP to decrease edema and pain with increase AROM in thumb , MC flexion and wrist    Baseline no knowledge - pain 5/10 and increase edema    Time 3    Period Weeks    Status New    Target Date 01/03/21               OT Long Term Goals - 12/13/20 0955       OT LONG TERM GOAL #1   Title Pt's AROM in  L hand MC and composite flexion of digits and wrist AROM increase to WNL to use hand more  than 50% in ADL's without increase symptoms    Baseline MC's decrease and composite flexion - 60's to 70's , wrist AROM decrease in all planes except RD - pain 5/10    Time 4    Period Weeks    Status New    Target Date 01/10/21      OT LONG TERM GOAL #2   Title L thumb AROM increase to [facility] to type on computer, pick up cup and do buttons    Baseline L thumb AROM impaired in all planes and joints and ont using thumb - pain  rest 5/10    Time 5    Period Weeks    Status New    Target Date 01/17/21      OT LONG TERM GOAL #3   Title L grip and prehension strength increase to more than 60% compare to R hand to pull ,push door, carry more than 5lbs and hold plate without increase symptoms    Baseline 3 1/2 wks - pain rest 5/10 - decrease AROM in all planes for digits, thumb and wrist -    Time 8    Period Weeks    Status New    Target Date 02/07/21                   Plan - 12/27/20 1708     Clinical Impression Statement Pt present at OT eval s/p L thumb CMC arthroplasty on 11/18/20 -pt 5 1/2 wks  -  Pt with decrease pain compare to eval and increase AROM in thumb in all planes and wrist - flexion limited more than extention -  cont to have edema over radial hand with some pain and stiffness- but pt report she has lymphedema diagnosis of L UE - and prior to surgery wore compression sleeve and used pump - but since surgery could not and arm swelling is increased - pt fitted with Tubigrip D for hand to elbow , and F from mid forearm to upperarm - for compression daytime and night time - isotoner glove and as needed  CMC neoprene to decrease pain in thumb with pinch and grip- did add strengthing for grip , thumb PA and RA this date -and 1 lbs for wrist in all planes - keep pain free- follow up later this week -  thumb spica at night time- review again scar massage and cica scar pad for night time  - pt with cont increase edema, decrease AROM in thumb,  MC and composite flexion of digits , and wrist AROM - with decrease strength limiting her functional use in ADL's and IADL's    OT Occupational Profile and History Problem Focused Assessment - Including review of records relating to presenting problem    Occupational performance deficits (Please refer to evaluation for details): ADL's;IADL's;Rest and Sleep;Work;Play;Social Participation;Leisure    Body Structure / Function / Physical Skills ADL;Decreased knowledge of  precautions;Coordination;Flexibility;Sensation;IADL;ROM;Edema;Dexterity;Pain;Strength    Rehab Potential Good    Clinical Decision Making Limited treatment options, no task modification necessary    Comorbidities Affecting Occupational Performance: None    Modification or Assistance to Complete Evaluation  No modification of tasks or assist necessary to complete eval    OT Frequency 2x / week    OT Duration 8 weeks    OT Treatment/Interventions Self-care/ADL training;DME and/or AE instruction;Fluidtherapy;Splinting;Contrast Bath;Therapeutic exercise;Scar mobilization;Paraffin;Manual Therapy;Patient/family education;Passive range of motion    Consulted and Agree with Plan of Care Patient  Patient will benefit from skilled therapeutic intervention in order to improve the following deficits and impairments:   Body Structure / Function / Physical Skills: ADL, Decreased knowledge of precautions, Coordination, Flexibility, Sensation, IADL, ROM, Edema, Dexterity, Pain, Strength       Visit Diagnosis: Stiffness of left hand, not elsewhere classified  Localized edema  Muscle weakness (generalized)  Pain in left hand    Problem List Patient Active Problem List   Diagnosis Date Noted   Lymphedema 01/14/2019   Essential hypertension 10/10/2018   Elevated blood pressure reading 01/07/2018   Elevated liver enzymes 01/07/2018   History of ulcerative colitis 01/07/2018   Pure hypercholesterolemia 09/28/2017   Type 2 diabetes mellitus without complication, without long-term current use of insulin ([place]) 09/28/2017   Acute upper respiratory infection 03/09/2010   Radial styloid tenosynovitis 02/15/2010   Sprain of MCL (medial collateral ligament) of knee 12/13/2009   Allergy 06/10/2009   Ulcerative colitis ([place]) 06/10/2009   Abdominal pain 08/26/2008   Pain in limb 06/17/2008   Dysmenorrhea 01/17/2008   Bronchitis, not specified as acute or chronic 01/28/2007    Rosalyn Gess, OTR/L,CLT 12/27/2020, 5:14 PM  [facility] [address], [place], [address] Phone: [phone]   Fax:  [phone]  Name: Donasia Sobczyk MRN: [id number] Date of Birth: 02/10/1961

## 2020-12-30 ENCOUNTER — Ambulatory Visit: Payer: 59 | Attending: Orthopedic Surgery | Admitting: Occupational Therapy

## 2020-12-30 DIAGNOSIS — M25642 Stiffness of left hand, not elsewhere classified: Secondary | ICD-10-CM | POA: Diagnosis present

## 2020-12-30 DIAGNOSIS — M79642 Pain in left hand: Secondary | ICD-10-CM | POA: Diagnosis present

## 2020-12-30 DIAGNOSIS — M6281 Muscle weakness (generalized): Secondary | ICD-10-CM | POA: Diagnosis present

## 2020-12-30 DIAGNOSIS — R600 Localized edema: Secondary | ICD-10-CM | POA: Diagnosis present

## 2020-12-30 NOTE — Therapy (Signed)
[facility] [address] [place], [address] Phone: [phone]   Fax:  [phone]  Occupational Therapy Treatment  Patient Details  Name: Connie [facility] MRN: [id number] Date of Birth: [birth year] Referring Provider (OT): Dr Rosita Kea   Encounter Date: 12/30/2020   OT End of Session - 12/30/20 1950     Visit Number 4    Number of Visits 12    Date for OT Re-Evaluation 02/07/21    OT Start Time 1530    OT Stop Time 1620    OT Time Calculation (min) 50 min    Activity Tolerance Patient tolerated treatment well    Behavior During Therapy Connie [facility] for tasks assessed/performed             Past Medical History:  Diagnosis Date   Asthma    changed states and has had no issues with asthma   Complication of anesthesia    woke up during 1st colonoscopy   Diabetes mellitus without complication ([facility])    Type 2   Fatty liver    Hypertension    Ulcerative colitis ([facility])     Past Surgical History:  Procedure Laterality Date   ABDOMINAL HYSTERECTOMY     CARPOMETACARPAL ([facility]) FUSION OF THUMB Left 11/18/2020   Procedure: CARPOMETACARPAL [facility]) FUSION OF THUMB;  Surgeon: Kennedy Bucker, MD;  Location: [facility] ORS;  Service: Orthopedics;  Laterality: Left;   CHOLECYSTECTOMY     COLONOSCOPY      There were no vitals filed for this visit.   Subjective Assessment - 12/30/20 1947     Subjective  Doing okay with my exercises- done maybe little more reps than you told me - but feeling okay -I wore those compression sleeves for my arm you gave me - I did bring a tighter one that I had in the past for you to try - my pump is set at - I can call the company to lower it - my arm was really big 2 yrs ago when I got it    Pertinent History [name] is a 60 y.o. female who was seen by Ortho on 12/02/20 - post left thumb CMC arthroplasty by Dr. Kennedy Bucker on 11/18/2020. Patient is doing well. Pain and swelling improving. Not  taking much pain medicine at this time. She is wearing a thumb brace. No warmth erythema or drainage. Still little bit of numbness along the tip of the thumb but this is improving  - refer to OT    Patient Stated Goals I want to be able to bake and do sewing with my hand - and thumb- get the motion and strength better    Currently in Pain? Yes    Pain Score 2                 [facility] OT Assessment - 12/30/20 0001       Strength   Right Hand Grip (lbs) 46    Right Hand Lateral Pinch 13 lbs    Right Hand 3 Point Pinch 13 lbs    Left Hand Grip (lbs) 26    Left Hand Lateral Pinch 4 lbs    Left Hand 3 Point Pinch 3 lbs             LYMPHEDEMA/ONCOLOGY QUESTIONNAIRE - 12/30/20 0001       Left Upper Extremity Lymphedema   15 cm Proximal to Olecranon Process 42 cm    10 cm Proximal to  Olecranon Process 39.8 cm    Olecranon Process 30.5 cm    15 cm Proximal to Ulnar Styloid Process 29 cm    10 cm Proximal to Ulnar Styloid Process 25.5 cm    Just Proximal to Ulnar Styloid Process 17.5 cm            Circumference decrease in L UE with tubigrip compression - she brought her old [employer] strong compression sleeve but to tight at wrist  Tourniquet  And then her 2 [employer] sleeves to loose - pt report she lost about 20lbs since they were fitted 2 yrs ago Recommend for pt to contact rep to lower pressure on her pump  And wear Tubigrip compression OT fitted last time - one for night time and one for daytime           OT Treatments/Exercises (OP) - 12/30/20 0001       LUE Contrast Bath   Time 8 minutes    Comments prior to soft tissue and ROM              Contrast done to decrease edema and prior to soft tissue mobs - and decrease stiffness  Review and to cont scar massage and cica scar pad use nigh time again Fitted  last time with Tubigrip D hand to elbow , E from mid forearm to upper arm  And isotoner glove       Isotoner glove under thumb spica night time  And  tendon glides  AAROM for wrist flexion, ext,RD, UD pain free to do prior to weight   1 lbs for wrist in all planes - 2 x 12 reps  2 x day  Cont with  thumb circular, thumb PA and RA - opposition   Light blue putty for gripping  2 x 12 reps  2 x day - rubber band for PA and RA of thumb  2x 12 reps painfree Increase in 3 days to 3rd set for all  Add 3 point pinch and lat grip- light blue putty - CMC neoprene splint on for these to decrease pain  2nd set in 3 days and 3rd set in 6 days - pain free   Keep pain under 1-2/10 - slight pull or stretch          OT Education - 12/30/20 1950     Education Details progress and changes to HEP    Person(s) Educated Patient    Methods Explanation;Demonstration;Tactile cues;Verbal cues;Handout    Comprehension Verbal cues required;Returned demonstration;Verbalized understanding              OT Short Term Goals - 12/13/20 0954       OT SHORT TERM GOAL #1   Title Pt to be independent in HEP to decrease edema and pain with increase AROM in thumb , MC flexion and wrist    Baseline no knowledge - pain 5/10 and increase edema    Time 3    Period Weeks    Status New    Target Date 01/03/21               OT Long Term Goals - 12/13/20 0955       OT LONG TERM GOAL #1   Title Pt's AROM in  L hand MC and composite flexion of digits and wrist AROM increase to WNL to use hand more than 50% in ADL's without increase symptoms    Baseline MC's decrease and composite flexion - 60's to 70's ,  wrist AROM decrease in all planes except RD - pain 5/10    Time 4    Period Weeks    Status New    Target Date 01/10/21      OT LONG TERM GOAL #2   Title L thumb AROM increase to [facility] to type on computer, pick up cup and do buttons    Baseline L thumb AROM impaired in all planes and joints and ont using thumb - pain rest 5/10    Time 5    Period Weeks    Status New    Target Date 01/17/21      OT LONG TERM GOAL #3   Title L grip and prehension  strength increase to more than 60% compare to R hand to pull ,push door, carry more than 5lbs and hold plate without increase symptoms    Baseline 3 1/2 wks - pain rest 5/10 - decrease AROM in all planes for digits, thumb and wrist -    Time 8    Period Weeks    Status New    Target Date 02/07/21                   Plan - 12/30/20 1951     Clinical Impression Statement Pt this date 6 wks s/p  - she had thumb CMC arthroplasty on 11/18/20  by Dr Rudene Christians.  Pt with decrease pain compare to eval and increase AROM in thumb in all planes and wrist - Wrist  flexion limited more than extention -  cont to have some  edema over radial hand with some  pain and stiffness- pt report she has lymphedema diagnosis of L UE about 2 yrs ago - and prior to surgery wore compression sleeve and used  a pump - but since surgery could not and arm's swelling is increased -  Assess her fit of compression - but to large pt lost 20 lbs since fitted 2 yrs ago -and other one to tight at wrist - pt fitted with Tubigrip D for hand to elbow , and F from mid forearm to upperarm to use for compression daytime and night time - isotoner glove and as needed  CMC neoprene to decrease pain in thumb with pinches - Her pump is set at 24mmhg - recommend for her to contact Rep to decrease pressure of pump and check with Dr Rudene Christians if she can start using pump when pressure is lower -  did initiated  strengthing for grip/prehension , thumb PA and RA this week  -and 1 lbs for wrist in all planes - keep pain free- increase reps and sets paindfree over the next week - can discharge thumb spica at night time if okay with DR Rudene Christians tomorrow - review again scar massage and cica scar pad for night time  - pt with cont increase edema, decrease AROM in thumb,  MC and composite flexion of digits , and wrist AROM - with decrease strength limiting her functional use in ADL's and IADL's    OT Occupational Profile and History Problem Focused Assessment - Including  review of records relating to presenting problem    Occupational performance deficits (Please refer to evaluation for details): ADL's;IADL's;Rest and Sleep;Work;Play;Social Participation;Leisure    Body Structure / Function / Physical Skills ADL;Decreased knowledge of precautions;Coordination;Flexibility;Sensation;IADL;ROM;Edema;Dexterity;Pain;Strength    Rehab Potential Good    Clinical Decision Making Limited treatment options, no task modification necessary    Comorbidities Affecting Occupational Performance: None    Modification or  Assistance to Complete Evaluation  No modification of tasks or assist necessary to complete eval    OT Frequency 2x / week    OT Duration 8 weeks    OT Treatment/Interventions Self-care/ADL training;DME and/or AE instruction;Fluidtherapy;Splinting;Contrast Bath;Therapeutic exercise;Scar mobilization;Paraffin;Manual Therapy;Patient/family education;Passive range of motion    Consulted and Agree with Plan of Care Patient             Patient will benefit from skilled therapeutic intervention in order to improve the following deficits and impairments:   Body Structure / Function / Physical Skills: ADL, Decreased knowledge of precautions, Coordination, Flexibility, Sensation, IADL, ROM, Edema, Dexterity, Pain, Strength       Visit Diagnosis: Stiffness of left hand, not elsewhere classified  Localized edema  Muscle weakness (generalized)  Pain in left hand    Problem List Patient Active Problem List   Diagnosis Date Noted   Lymphedema 01/14/2019   Essential hypertension 10/10/2018   Elevated blood pressure reading 01/07/2018   Elevated liver enzymes 01/07/2018   History of ulcerative colitis 01/07/2018   Pure hypercholesterolemia 09/28/2017   Type 2 diabetes mellitus without complication, without long-term current use of insulin ([place]) 09/28/2017   Acute upper respiratory infection 03/09/2010   Radial styloid tenosynovitis 02/15/2010   Sprain of  MCL (medial collateral ligament) of knee 12/13/2009   Allergy 06/10/2009   Ulcerative colitis ([place]) 06/10/2009   Abdominal pain 08/26/2008   Pain in limb 06/17/2008   Dysmenorrhea 01/17/2008   Bronchitis, not specified as acute or chronic 01/28/2007    Rosalyn Gess, OTR/L,CLT 12/30/2020, 8:02 PM  [facility] [address], [place], [address] Phone: [phone]   Fax:  [phone]  Name: Connie [facility] MRN: [id number] Date of Birth: 1960/12/18

## 2021-01-04 ENCOUNTER — Other Ambulatory Visit: Payer: Self-pay

## 2021-01-04 ENCOUNTER — Ambulatory Visit
Admission: RE | Admit: 2021-01-04 | Discharge: 2021-01-04 | Disposition: A | Discharge status: Home / Self Care | Payer: 59 | Source: Ambulatory Visit | Attending: Internal Medicine | Admitting: Internal Medicine

## 2021-01-04 DIAGNOSIS — Z1231 Encounter for screening mammogram for malignant neoplasm of breast: Secondary | ICD-10-CM | POA: Insufficient documentation

## 2021-01-06 ENCOUNTER — Ambulatory Visit: Payer: 59 | Admitting: Occupational Therapy

## 2021-01-06 DIAGNOSIS — M25642 Stiffness of left hand, not elsewhere classified: Secondary | ICD-10-CM

## 2021-01-06 DIAGNOSIS — R600 Localized edema: Secondary | ICD-10-CM

## 2021-01-06 DIAGNOSIS — M79642 Pain in left hand: Secondary | ICD-10-CM

## 2021-01-06 DIAGNOSIS — M6281 Muscle weakness (generalized): Secondary | ICD-10-CM

## 2021-01-06 NOTE — Therapy (Signed)
[facility] [address] [place], [address] Phone: [phone]   Fax:  [phone]  Occupational Therapy Treatment  Patient Details  Name: Connie Salazar MRN: [id number] Date of Birth: 1960-06-19 Referring Provider (OT): Dr Rosita Kea   Encounter Date: 01/06/2021   OT End of Session - 01/06/21 1623     Visit Number 5    Number of Visits 12    Date for OT Re-Evaluation 02/07/21    OT Start Time 1445    OT Stop Time 1530    OT Time Calculation (min) 45 min    Activity Tolerance Patient tolerated treatment well    Behavior During Therapy [facility] for tasks assessed/performed             Past Medical History:  Diagnosis Date   Asthma    changed states and has had no issues with asthma   Complication of anesthesia    woke up during 1st colonoscopy   Diabetes mellitus without complication (HCC)    Type 2   Fatty liver    Hypertension    Ulcerative colitis (HCC)     Past Surgical History:  Procedure Laterality Date   ABDOMINAL HYSTERECTOMY     CARPOMETACARPAL ([facility]) FUSION OF THUMB Left 11/18/2020   Procedure: CARPOMETACARPAL [facility]) FUSION OF THUMB;  Surgeon: Kennedy Bucker, MD;  Location: [facility] ORS;  Service: Orthopedics;  Laterality: Left;   CHOLECYSTECTOMY     COLONOSCOPY      There were no vitals filed for this visit.   Subjective Assessment - 01/06/21 1620     Subjective  I did order me some new compression sleeves for my arm and going to check on them coming out to adjust my pump pressure- did see Dr Aris Georgia xray and said everything good and I am ahead of the game    Pertinent History Connie Salazar is a 60 y.o. female who was seen by Ortho on 12/02/20 - post left thumb CMC arthroplasty by Dr. Kennedy Bucker on 11/18/2020. Patient is doing well. Pain and swelling improving. Not taking much pain medicine at this time. She is wearing a thumb brace. No warmth erythema or drainage. Still little bit of numbness  along the tip of the thumb but this is improving  - refer to OT    Patient Stated Goals I want to be able to bake and do sewing with my hand - and thumb- get the motion and strength better    Currently in Pain? Yes    Pain Score 1     Pain Location Hand    Pain Orientation Left    Pain Descriptors / Indicators Sore    Pain Type Surgical pain    Pain Onset More than a month ago    Pain Frequency Intermittent                [facility] OT Assessment - 01/06/21 0001       Strength   Right Hand Grip (lbs) 46    Right Hand Lateral Pinch 13 lbs    Right Hand 3 Point Pinch 13 lbs    Left Hand Grip (lbs) 30    Left Hand Lateral Pinch 4 lbs    Left Hand 3 Point Pinch 5 lbs             LYMPHEDEMA/ONCOLOGY QUESTIONNAIRE - 01/06/21 0001       Left Upper Extremity Lymphedema   15 cm Proximal to Olecranon Process  41.5 cm    10 cm Proximal to Olecranon Process 39.5 cm    Olecranon Process 30.5 cm    15 cm Proximal to Ulnar Styloid Process 29 cm                     OT Treatments/Exercises (OP) - 01/06/21 0001       LUE Fluidotherapy   Number Minutes Fluidotherapy 6 Minutes    LUE Fluidotherapy Location Hand;Wrist    Comments prior to review of changes to HEP - decrease stiffness             Pt to cont scar massage and cica scar pad use nigh time again Fitted   in past with Tubigrip D hand to elbow , E from mid forearm to upper arm  Provided pt with new one And isotoner glove       Isotoner glove under thumb spica night time  And tendon glides  AAROM for wrist flexion, ext,RD, UD pain free to do prior to weight   upgrade to 2 lbs  for wrist in all planes - 1x 12- 15 reps  2 x day  Cont with  thumb circular, thumb PA and RA - opposition   Upgrade to med teal putty for gripping, lat and 3 point  1 x 12 -15reps  2 x day -  Cont with rubber band for PA and RA of thumb  2x 12 reps painfree Increase in 3 days 2nd set and 6 days 3 sets set for weight and putty   Pt had pain free today during putty and weight   Keep pain under 1-2/10 - slight pull or stretch       OT Education - 01/06/21 1623     Education Details progress and changes to HEP              OT Short Term Goals - 12/13/20 0954       OT SHORT TERM GOAL #1   Title Pt to be independent in HEP to decrease edema and pain with increase AROM in thumb , MC flexion and wrist    Baseline no knowledge - pain 5/10 and increase edema    Time 3    Period Weeks    Status New    Target Date 01/03/21               OT Long Term Goals - 12/13/20 0955       OT LONG TERM GOAL #1   Title Pt's AROM in  L hand MC and composite flexion of digits and wrist AROM increase to WNL to use hand more than 50% in ADL's without increase symptoms    Baseline MC's decrease and composite flexion - 60's to 70's , wrist AROM decrease in all planes except RD - pain 5/10    Time 4    Period Weeks    Status New    Target Date 01/10/21      OT LONG TERM GOAL #2   Title L thumb AROM increase to [facility] to type on computer, pick up cup and do buttons    Baseline L thumb AROM impaired in all planes and joints and ont using thumb - pain rest 5/10    Time 5    Period Weeks    Status New    Target Date 01/17/21      OT LONG TERM GOAL #3   Title L grip and prehension strength increase to more  than 60% compare to R hand to pull ,push door, carry more than 5lbs and hold plate without increase symptoms    Baseline 3 1/2 wks - pain rest 5/10 - decrease AROM in all planes for digits, thumb and wrist -    Time 8    Period Weeks    Status New    Target Date 02/07/21                   Plan - 01/06/21 1624     OT Occupational Profile and History Problem Focused Assessment - Including review of records relating to presenting problem    Occupational performance deficits (Please refer to evaluation for details): ADL's;IADL's;Rest and Sleep;Work;Play;Social Participation;Leisure    Body Structure /  Function / Physical Skills ADL;Decreased knowledge of precautions;Coordination;Flexibility;Sensation;IADL;ROM;Edema;Dexterity;Pain;Strength    Rehab Potential Good    Clinical Decision Making Limited treatment options, no task modification necessary    Comorbidities Affecting Occupational Performance: None    Modification or Assistance to Complete Evaluation  No modification of tasks or assist necessary to complete eval    OT Frequency 1x / week    OT Duration 8 weeks    OT Treatment/Interventions Self-care/ADL training;DME and/or AE instruction;Fluidtherapy;Splinting;Contrast Bath;Therapeutic exercise;Scar mobilization;Paraffin;Manual Therapy;Patient/family education;Passive range of motion    Consulted and Agree with Plan of Care Patient             Patient will benefit from skilled therapeutic intervention in order to improve the following deficits and impairments:   Body Structure / Function / Physical Skills: ADL, Decreased knowledge of precautions, Coordination, Flexibility, Sensation, IADL, ROM, Edema, Dexterity, Pain, Strength       Visit Diagnosis: Stiffness of left hand, not elsewhere classified  Localized edema  Muscle weakness (generalized)  Pain in left hand    Problem List Patient Active Problem List   Diagnosis Date Noted   Lymphedema 01/14/2019   Essential hypertension 10/10/2018   Elevated blood pressure reading 01/07/2018   Elevated liver enzymes 01/07/2018   History of ulcerative colitis 01/07/2018   Pure hypercholesterolemia 09/28/2017   Type 2 diabetes mellitus without complication, without long-term current use of insulin (HCC) 09/28/2017   Acute upper respiratory infection 03/09/2010   Radial styloid tenosynovitis 02/15/2010   Sprain of MCL (medial collateral ligament) of knee 12/13/2009   Allergy 06/10/2009   Ulcerative colitis (HCC) 06/10/2009   Abdominal pain 08/26/2008   Pain in limb 06/17/2008   Dysmenorrhea 01/17/2008   Bronchitis, not  specified as acute or chronic 01/28/2007    Oletta Cohn, OTR/L,CLT 01/06/2021, 4:27 PM  [facility] [address], [place], [address] Phone: [phone]   Fax:  [phone]  Name: Amyah Clawson MRN: [id number] Date of Birth: [birth year]

## 2021-01-07 ENCOUNTER — Encounter: Payer: 59 | Admitting: Occupational Therapy

## 2021-01-11 ENCOUNTER — Ambulatory Visit: Payer: 59 | Admitting: Occupational Therapy

## 2021-01-11 DIAGNOSIS — M6281 Muscle weakness (generalized): Secondary | ICD-10-CM

## 2021-01-11 DIAGNOSIS — M79642 Pain in left hand: Secondary | ICD-10-CM

## 2021-01-11 DIAGNOSIS — R600 Localized edema: Secondary | ICD-10-CM

## 2021-01-11 DIAGNOSIS — M25642 Stiffness of left hand, not elsewhere classified: Secondary | ICD-10-CM

## 2021-01-11 NOTE — Therapy (Signed)
[facility] [address] [place], [address] Phone: [phone]   Fax:  [phone]  Occupational Therapy Treatment  Patient Details  Name: Connie Salazar MRN: [id number] Date of Birth: 1960-06-14 Referring Provider (OT): Dr Rudene Christians   Encounter Date: 01/11/2021   OT End of Session - 01/11/21 1724     Visit Number 6    Number of Visits 12    Date for OT Re-Evaluation 02/07/21    OT Start Time [id number]    OT Stop Time 1620    OT Time Calculation (min) 50 min    Activity Tolerance Patient tolerated treatment well    Behavior During Therapy [facility] for tasks assessed/performed             Past Medical History:  Diagnosis Date   Asthma    changed states and has had no issues with asthma   Complication of anesthesia    woke up during 1st colonoscopy   Diabetes mellitus without complication ([place])    Type 2   Fatty liver    Hypertension    Ulcerative colitis ([place])     Past Surgical History:  Procedure Laterality Date   ABDOMINAL HYSTERECTOMY     CARPOMETACARPAL ([place]) FUSION OF THUMB Left 11/18/2020   Procedure: CARPOMETACARPAL [facility]) FUSION OF THUMB;  Surgeon: Hessie Knows, MD;  Location: [facility] ORS;  Service: Orthopedics;  Laterality: Left;   CHOLECYSTECTOMY     COLONOSCOPY      There were no vitals filed for this visit.   Subjective Assessment - 01/11/21 1529     Subjective  I picked up my new compression sleeve - done the putty and weight - 2 sets with putty - no pain and then the wrist 3 sets- still hard to open jar, cannot pull up my hose- I can do buttons do my hair    Pertinent History Connie Salazar is a 60 y.o. female who was seen by Ortho on 12/02/20 - post left thumb CMC arthroplasty by Dr. Hessie Knows on 11/18/2020. Patient is doing well. Pain and swelling improving. Not taking much pain medicine at this time. She is wearing a thumb brace. No warmth erythema or drainage. Still little bit of numbness  along the tip of the thumb but this is improving  - refer to OT    Patient Stated Goals I want to be able to bake and do sewing with my hand - and thumb- get the motion and strength better    Currently in Pain? Yes    Pain Score 1     Pain Location Hand    Pain Orientation Left    Pain Descriptors / Indicators Sore    Pain Type Surgical pain                [facility] OT Assessment - 01/11/21 0001       AROM   Left Wrist Extension 65 Degrees    Left Wrist Flexion 60 Degrees   after table slides 70 AROM     Strength   Right Hand Grip (lbs) 46    Right Hand Lateral Pinch 13 lbs    Right Hand 3 Point Pinch 13 lbs    Left Hand Grip (lbs) 30    Left Hand Lateral Pinch 7 lbs    Left Hand 3 Point Pinch 6 lbs  OT Treatments/Exercises (OP) - 01/11/21 0001       LUE Fluidotherapy   Number Minutes Fluidotherapy 8 Minutes    LUE Fluidotherapy Location Hand;Wrist    Comments prior to wrist ext and flexion stretch             Pt to cont scar massage and cica scar pad provided for her to use under compression on both scars  Cont to wear  Tubigrip D hand to elbow , E from mid forearm to upper arm  Provided pt with new ones And isotoner glove  And pt fitted with her new Medi Harmony sleeve for day time - recommend for pt to get slippy gator to ease donning       Warm up AROM for wrist and thumb in all planes  And tendon glides  Stretch for wrist flexion, ext pain free to do prior to weight    2 lbs  for wrist in all planes - 3 x 12- 15 reps  2 x day    thumb circular, thumb PA and RA - opposition   med teal  with 1/3 firm green for putty for gripping, lat and 3 point  2 x 12 -15reps  2 x day -  Cont with rubber band for PA and RA of thumb  2x 12 reps painfree Increase in 3 days  3 sets set for  putty  Pt had pain free today during putty and weight   Keep pain under 1-2/10 - slight pull or stretch Ed on joint protection and AE - info provided               OT Education - 01/11/21 1724     Education Details progress and changes to HEP/compression sleeve fitting - joint protection ed    Person(s) Educated Patient    Methods Explanation;Demonstration;Tactile cues;Verbal cues;Handout    Comprehension Verbal cues required;Returned demonstration;Verbalized understanding              OT Short Term Goals - 12/13/20 0954       OT SHORT TERM GOAL #1   Title Pt to be independent in HEP to decrease edema and pain with increase AROM in thumb , MC flexion and wrist    Baseline no knowledge - pain 5/10 and increase edema    Time 3    Period Weeks    Status New    Target Date 01/03/21               OT Long Term Goals - 12/13/20 0955       OT LONG TERM GOAL #1   Title Pt's AROM in  L hand MC and composite flexion of digits and wrist AROM increase to WNL to use hand more than 50% in ADL's without increase symptoms    Baseline MC's decrease and composite flexion - 60's to 70's , wrist AROM decrease in all planes except RD - pain 5/10    Time 4    Period Weeks    Status New    Target Date 01/10/21      OT LONG TERM GOAL #2   Title L thumb AROM increase to [facility] to type on computer, pick up cup and do buttons    Baseline L thumb AROM impaired in all planes and joints and ont using thumb - pain rest 5/10    Time 5    Period Weeks    Status New    Target Date 01/17/21  OT LONG TERM GOAL #3   Title L grip and prehension strength increase to more than 60% compare to R hand to pull ,push door, carry more than 5lbs and hold plate without increase symptoms    Baseline 3 1/2 wks - pain rest 5/10 - decrease AROM in all planes for digits, thumb and wrist -    Time 8    Period Weeks    Status New    Target Date 02/07/21                   Plan - 01/11/21 1725     Clinical Impression Statement Pt this date 6 wks s/p  - she had thumb CMC arthroplasty on 11/18/20  by Dr Rudene Christians.  Pt with decrease pain compare to  eval and increase AROM in thumb in all planes and wrist - Wrist  flexion limited more than extention -  add and done stretches for wrist flexion using her heatingpad and table slides for extention -  cont to have some  edema over radial hand with some  pain and stiffness- pt report she has lymphedema diagnosis of L UE about 2 yrs ago - and prior to surgery wore compression sleeve and used  a pump - but since surgery could not and arm's swelling is increased still at elbow and upper arm mostly - pt fitted with her new Medi Harmony sleeve to wear during day -and she called about them checking on her pump pressure. Pt to cont to wear at night time Tubigrip D for hand to elbow , and F from mid forearm to upperarm to use for compression daytime and night time - isotoner glove and as needed  CMC neoprene to decrease pain in thumb with pinches - Pt to cont with strengthing for grip/prehension , thumb PA and RA and wrist - doing 2 lbs weight and med teal putty .Provided her with cica scar pad for night time  - pt with cont increase edema, decrease wrist AROM - with decrease strength limiting her functional use in ADL's and IADL's    OT Occupational Profile and History Problem Focused Assessment - Including review of records relating to presenting problem    Occupational performance deficits (Please refer to evaluation for details): ADL's;IADL's;Rest and Sleep;Work;Play;Social Participation;Leisure    Body Structure / Function / Physical Skills ADL;Decreased knowledge of precautions;Coordination;Flexibility;Sensation;IADL;ROM;Edema;Dexterity;Pain;Strength    Rehab Potential Good    Clinical Decision Making Limited treatment options, no task modification necessary    Comorbidities Affecting Occupational Performance: None    Modification or Assistance to Complete Evaluation  No modification of tasks or assist necessary to complete eval    OT Frequency 1x / week    OT Duration 8 weeks    OT Treatment/Interventions  Self-care/ADL training;DME and/or AE instruction;Fluidtherapy;Splinting;Contrast Bath;Therapeutic exercise;Scar mobilization;Paraffin;Manual Therapy;Patient/family education;Passive range of motion    Consulted and Agree with Plan of Care Patient             Patient will benefit from skilled therapeutic intervention in order to improve the following deficits and impairments:   Body Structure / Function / Physical Skills: ADL, Decreased knowledge of precautions, Coordination, Flexibility, Sensation, IADL, ROM, Edema, Dexterity, Pain, Strength       Visit Diagnosis: Stiffness of left hand, not elsewhere classified  Muscle weakness (generalized)  Localized edema  Pain in left hand    Problem List Patient Active Problem List   Diagnosis Date Noted   Lymphedema 01/14/2019   Essential hypertension 10/10/2018  Elevated blood pressure reading 01/07/2018   Elevated liver enzymes 01/07/2018   History of ulcerative colitis 01/07/2018   Pure hypercholesterolemia 09/28/2017   Type 2 diabetes mellitus without complication, without long-term current use of insulin (HCC) 09/28/2017   Acute upper respiratory infection 03/09/2010   Radial styloid tenosynovitis 02/15/2010   Sprain of MCL (medial collateral ligament) of knee 12/13/2009   Allergy 06/10/2009   Ulcerative colitis (HCC) 06/10/2009   Abdominal pain 08/26/2008   Pain in limb 06/17/2008   Dysmenorrhea 01/17/2008   Bronchitis, not specified as acute or chronic 01/28/2007    Oletta Cohn, OTR/L,CLT 01/11/2021, 5:31 PM  [facility] [address], [place], [address] Phone: [phone]   Fax:  [phone]  Name: Connie Salazar MRN: [id number] Date of Birth: 1960-03-05

## 2021-01-17 ENCOUNTER — Ambulatory Visit: Payer: 59 | Admitting: Occupational Therapy

## 2021-01-19 ENCOUNTER — Ambulatory Visit: Payer: 59 | Admitting: Occupational Therapy

## 2021-01-24 ENCOUNTER — Ambulatory Visit: Payer: 59 | Admitting: Occupational Therapy

## 2021-01-31 ENCOUNTER — Ambulatory Visit: Payer: 59 | Attending: Orthopedic Surgery | Admitting: Occupational Therapy

## 2021-01-31 DIAGNOSIS — M79642 Pain in left hand: Secondary | ICD-10-CM | POA: Diagnosis present

## 2021-01-31 DIAGNOSIS — M6281 Muscle weakness (generalized): Secondary | ICD-10-CM | POA: Insufficient documentation

## 2021-01-31 DIAGNOSIS — R600 Localized edema: Secondary | ICD-10-CM | POA: Diagnosis present

## 2021-01-31 DIAGNOSIS — M25642 Stiffness of left hand, not elsewhere classified: Secondary | ICD-10-CM | POA: Diagnosis not present

## 2021-01-31 NOTE — Therapy (Signed)
[place] [facility] [address], [place], [address] Phone: [phone]   Fax:  [phone]  Occupational Therapy Treatment  Patient Details  Name: Connie Salazar MRN: [id number] Date of Birth: December 12, 1960 Referring Provider (OT): Dr Rudene Christians   Encounter Date: 01/31/2021   OT End of Session - 01/31/21 1605     Visit Number 7    Number of Visits 12    Date for OT Re-Evaluation 02/07/21    OT Start Time 1603    OT Stop Time 1648    OT Time Calculation (min) 45 min    Activity Tolerance Patient tolerated treatment well    Behavior During Therapy [facility] for tasks assessed/performed             Past Medical History:  Diagnosis Date   Asthma    changed states and has had no issues with asthma   Complication of anesthesia    woke up during 1st colonoscopy   Diabetes mellitus without complication ([place])    Type 2   Fatty liver    Hypertension    Ulcerative colitis ([place])     Past Surgical History:  Procedure Laterality Date   ABDOMINAL HYSTERECTOMY     CARPOMETACARPAL ([place]) FUSION OF THUMB Left 11/18/2020   Procedure: CARPOMETACARPAL [facility]) FUSION OF THUMB;  Surgeon: Hessie Knows, MD;  Location: [facility] ORS;  Service: Orthopedics;  Laterality: Left;   CHOLECYSTECTOMY     COLONOSCOPY      There were no vitals filed for this visit.   Subjective Assessment - 01/31/21 1604     Subjective  Over thanks giving spider bit me on my thumb - so did some ice , Tylenol and Bennedryl-  doing okay - putty and weigth for wrist okay - no pain - do favor my thumb still    Pertinent History Connie Salazar is a 60 y.o. female who was seen by Ortho on 12/02/20 - post left thumb CMC arthroplasty by Dr. Hessie Knows on 11/18/2020. Patient is doing well. Pain and swelling improving. Not taking much pain medicine at this time. She is wearing a thumb brace. No warmth erythema or drainage. Still little bit of numbness along the tip of the thumb but  this is improving  - refer to OT    Patient Stated Goals I want to be able to bake and do sewing with my hand - and thumb- get the motion and strength better    Currently in Pain? Yes    Pain Score 2     Pain Location Hand    Pain Orientation Left    Pain Descriptors / Indicators Sore    Pain Type Surgical pain    Pain Onset More than a month ago    Pain Frequency Intermittent                [facility] OT Assessment - 01/31/21 0001       AROM   Left Wrist Extension 70 Degrees    Left Wrist Flexion 70 Degrees      Strength   Right Hand Grip (lbs) 46    Right Hand Lateral Pinch 13 lbs    Right Hand 3 Point Pinch 13 lbs    Left Hand Grip (lbs) 22    Left Hand Lateral Pinch 8 lbs             LYMPHEDEMA/ONCOLOGY QUESTIONNAIRE - 01/31/21 0001       Left Upper Extremity Lymphedema  15 cm Proximal to Olecranon Process 40 cm    10 cm Proximal to Olecranon Process 39.5 cm    Olecranon Process 30.5 cm    15 cm Proximal to Ulnar Styloid Process 28.5 cm    10 cm Proximal to Ulnar Styloid Process 25.5 cm    Just Proximal to Ulnar Styloid Process 17.5 cm                     OT Treatments/Exercises (OP) - 01/31/21 0001       LUE Fluidotherapy   Number Minutes Fluidotherapy 8 Minutes    LUE Fluidotherapy Location Hand;Wrist    Comments prior to review of HEP - decrease pain               Pt to cont scar massage  Cont to wear  Tubigrip D hand to elbow , E from mid forearm to upper arm change too Provided pt with new ones And isotoner glove  Pt  wearing  during day her new Medi Harmony sleeve        Warm up AROM for wrist and thumb in all planes  And tendon glides  Stretch for wrist flexion, ext pain free to do prior to weight    2 lbs  for wrist in all planes - 3 x 12- 15 reps  2 x day    thumb circular, thumb PA and RA - opposition   Upgrade to firm green  putty for gripping, lat and 3 point  1 x 12 -15reps  2 x day -  Cont with rubber band for PA  and RA of thumb  Increase in 3 days   2nd set and then if pain free in 5 days to 3 sets set for  putty  Pt had pain free today during putty and weight   Keep pain under 1-2/10 - slight pull or stretch Ed on joint protection and AE - info provided and to use thumb more        OT Education - 01/31/21 1749     Education Details progress and changes to HEP/compression sleeve fitting - joint protection ed    Person(s) Educated Patient    Methods Explanation;Demonstration;Tactile cues;Verbal cues;Handout    Comprehension Verbal cues required;Returned demonstration;Verbalized understanding              OT Short Term Goals - 12/13/20 0954       OT SHORT TERM GOAL #1   Title Pt to be independent in HEP to decrease edema and pain with increase AROM in thumb , MC flexion and wrist    Baseline no knowledge - pain 5/10 and increase edema    Time 3    Period Weeks    Status New    Target Date 01/03/21               OT Long Term Goals - 12/13/20 0955       OT LONG TERM GOAL #1   Title Pt's AROM in  L hand MC and composite flexion of digits and wrist AROM increase to WNL to use hand more than 50% in ADL's without increase symptoms    Baseline MC's decrease and composite flexion - 60's to 70's , wrist AROM decrease in all planes except RD - pain 5/10    Time 4    Period Weeks    Status New    Target Date 01/10/21      OT LONG TERM GOAL #2  Title L thumb AROM increase to [facility] to type on computer, pick up cup and do buttons    Baseline L thumb AROM impaired in all planes and joints and ont using thumb - pain rest 5/10    Time 5    Period Weeks    Status New    Target Date 01/17/21      OT LONG TERM GOAL #3   Title L grip and prehension strength increase to more than 60% compare to R hand to pull ,push door, carry more than 5lbs and hold plate without increase symptoms    Baseline 3 1/2 wks - pain rest 5/10 - decrease AROM in all planes for digits, thumb and wrist -     Time 8    Period Weeks    Status New    Target Date 02/07/21                   Plan - 01/31/21 1749     Clinical Impression Statement Pt this date 10 1/2 wks s/p  - she had thumb CMC arthroplasty on 11/18/20  by Dr Rudene Christians.  Pt with decrease pain compare to eval and increase AROM in thumb in all planes and wrist - Wrist  flexion limited more than extention but improving - pt to cont with 2 lbs weight for wrist - pt report she has lymphedema diagnosis of L UE about 2 yrs ago - and prior to surgery wore compression sleeve and used  a pump - but since surgery could not and arm's swelling is increased still at elbow and upper arm mostly - but pt wearing new  Medi Harmony sleeve  during day and pump was callibrated to 71mmhg  Pt to cont to wear at night time Tubigrip D for hand to elbow , and now E from mid forearm to upperarm. CMC neoprene to decrease pain in thumb if needed during day - Pt to cont with strengthing for grip/prehension , thumb PA and RA and wrist - doing 2 lbs weight and firm green putty- pt with cont increase edema, decrease wrist AROM - with decrease strength limiting her functional use in ADL's and IADL's    OT Occupational Profile and History Problem Focused Assessment - Including review of records relating to presenting problem    Occupational performance deficits (Please refer to evaluation for details): ADL's;IADL's;Rest and Sleep;Work;Play;Social Participation;Leisure    Body Structure / Function / Physical Skills ADL;Decreased knowledge of precautions;Coordination;Flexibility;Sensation;IADL;ROM;Edema;Dexterity;Pain;Strength    Rehab Potential Good    Clinical Decision Making Limited treatment options, no task modification necessary    Comorbidities Affecting Occupational Performance: None    Modification or Assistance to Complete Evaluation  No modification of tasks or assist necessary to complete eval    OT Frequency 1x / week    OT Duration 8 weeks    OT  Treatment/Interventions Self-care/ADL training;DME and/or AE instruction;Fluidtherapy;Splinting;Contrast Bath;Therapeutic exercise;Scar mobilization;Paraffin;Manual Therapy;Patient/family education;Passive range of motion    Consulted and Agree with Plan of Care Patient             Patient will benefit from skilled therapeutic intervention in order to improve the following deficits and impairments:   Body Structure / Function / Physical Skills: ADL, Decreased knowledge of precautions, Coordination, Flexibility, Sensation, IADL, ROM, Edema, Dexterity, Pain, Strength       Visit Diagnosis: Stiffness of left hand, not elsewhere classified  Muscle weakness (generalized)  Localized edema  Pain in left hand    Problem List Patient  Active Problem List   Diagnosis Date Noted   Lymphedema 01/14/2019   Essential hypertension 10/10/2018   Elevated blood pressure reading 01/07/2018   Elevated liver enzymes 01/07/2018   History of ulcerative colitis 01/07/2018   Pure hypercholesterolemia 09/28/2017   Type 2 diabetes mellitus without complication, without long-term current use of insulin (HCC) 09/28/2017   Acute upper respiratory infection 03/09/2010   Radial styloid tenosynovitis 02/15/2010   Sprain of MCL (medial collateral ligament) of knee 12/13/2009   Allergy 06/10/2009   Ulcerative colitis (HCC) 06/10/2009   Abdominal pain 08/26/2008   Pain in limb 06/17/2008   Dysmenorrhea 01/17/2008   Bronchitis, not specified as acute or chronic 01/28/2007    Oletta Cohn, OTR/L,CLT 01/31/2021, 5:54 PM  [facility] [address] [place], [address] Phone: [phone]   Fax:  [phone]  Name: Connie Salazar MRN: [id number] Date of Birth: 01/14/1961

## 2021-02-09 ENCOUNTER — Ambulatory Visit: Payer: 59 | Admitting: Occupational Therapy

## 2021-02-16 ENCOUNTER — Encounter: Payer: Self-pay | Admitting: Occupational Therapy

## 2021-02-16 ENCOUNTER — Ambulatory Visit: Payer: 59 | Admitting: Occupational Therapy

## 2021-02-16 DIAGNOSIS — M25642 Stiffness of left hand, not elsewhere classified: Secondary | ICD-10-CM | POA: Diagnosis not present

## 2021-02-16 DIAGNOSIS — R600 Localized edema: Secondary | ICD-10-CM

## 2021-02-16 DIAGNOSIS — M6281 Muscle weakness (generalized): Secondary | ICD-10-CM

## 2021-02-16 DIAGNOSIS — M79642 Pain in left hand: Secondary | ICD-10-CM

## 2021-02-23 NOTE — Therapy (Signed)
[place] [facility] [address] [place], [address] Phone: [phone]   Fax:  [phone]  Occupational Therapy Treatment/Recertification  Patient Details  Name: Connie Salazar MRN: [id number] Date of Birth: 11-Jul-1960 Referring Provider (OT): Dr Rudene Christians   Encounter Date: 02/16/2021   OT End of Session - 02/23/21 0935     Visit Number 8    Number of Visits 14    Date for OT Re-Evaluation 04/01/21    OT Start Time 41    OT Stop Time 1627    OT Time Calculation (min) 47 min    Activity Tolerance Patient tolerated treatment well    Behavior During Therapy [facility] for tasks assessed/performed             Past Medical History:  Diagnosis Date   Asthma    changed states and has had no issues with asthma   Complication of anesthesia    woke up during 1st colonoscopy   Diabetes mellitus without complication ([place])    Type 2   Fatty liver    Hypertension    Ulcerative colitis ([place])     Past Surgical History:  Procedure Laterality Date   ABDOMINAL HYSTERECTOMY     CARPOMETACARPAL ([place]) FUSION OF THUMB Left 11/18/2020   Procedure: CARPOMETACARPAL [facility]) FUSION OF THUMB;  Surgeon: Hessie Knows, MD;  Location: [facility] ORS;  Service: Orthopedics;  Laterality: Left;   CHOLECYSTECTOMY     COLONOSCOPY      There were no vitals filed for this visit.   Subjective Assessment - 02/23/21 0934     Subjective  Pt reports she over did it over the weekend making homemade cinnamon rolls and other baking, had some stiffness.    Pertinent History Connie Salazar is a 60 y.o. female who was seen by Ortho on 12/02/20 - post left thumb CMC arthroplasty by Dr. Hessie Knows on 11/18/2020. Patient is doing well. Pain and swelling improving. Not taking much pain medicine at this time. She is wearing a thumb brace. No warmth erythema or drainage. Still little bit of numbness along the tip of the thumb but this is improving  - refer to OT     Patient Stated Goals I want to be able to bake and do sewing with my hand - and thumb- get the motion and strength better    Currently in Pain? Yes    Pain Score 2     Pain Location Hand    Pain Orientation Left    Pain Descriptors / Indicators Sore;Aching    Pain Type Surgical pain    Pain Onset More than a month ago               [facility] OT Assessment - 02/22/21 1859       AROM   Left Wrist Extension 68 Degrees    Left Wrist Flexion 70 Degrees    Left Wrist Radial Deviation 22 Degrees    Left Wrist Ulnar Deviation 32 Degrees      Strength   Right Hand Grip (lbs) 46    Right Hand Lateral Pinch 13 lbs    Right Hand 3 Point Pinch 13 lbs    Left Hand Grip (lbs) 32    Left Hand Lateral Pinch 8 lbs    Left Hand 3 Point Pinch 7 lbs              LYMPHEDEMA/ONCOLOGY QUESTIONNAIRE - 02/22/21 1901  Left Upper Extremity Lymphedema   15 cm Proximal to Olecranon Process 40 cm    10 cm Proximal to Olecranon Process 38 cm    Olecranon Process 31 cm    15 cm Proximal to Ulnar Styloid Process 29 cm    10 cm Proximal to Ulnar Styloid Process 26.3 cm    Just Proximal to Ulnar Styloid Process 17 cm            Fluidotherapy to left hand and wrist for 10 mins to decrease pain, increase tissue mobility and increase ROM.  Temp at 105 degrees, pt instructed to move while in fluido actively for left hand and wrist.    Measurements taken see above flow sheet for details for left hand as well as circumferential measurements of arm.    Pt seen for manual skills for scar management of left hand for scar massage by therapist in all directions, use of vibration over scar and use of extractor to decrease adhesions.    Review of HEP and performance of daily exercises in the clinic Tendon glides AROM wrist and thumb in all planes Wrist flexion with 2# weight  increase to 3 sets, 15 reps each.  Radial and palmar thumb ABD, circular movements Rubber band for PA and RA, increase to 3  sets of 12-15 reps. Putty with teal strength for grip, lateral and 3 point pinch increase to 3 sets 12 reps, 2 x a day.   Response to tx: Pt has continued to progress well in all areas, goals partially met.  Increased exercises to 3 sets for 2 times a day.  No pain this date and has returned to some of her baking and cooking with modifications to activity but would benefit from additional joint protection principles for the kitchen with baking.  AROM and strength have continued to improve with measurements.  Pt continues to benefit from skilled OT services to maximize safety and independence in ADL and IADL tasks.                   OT Education - 02/23/21 0935     Education Details HEP, scar massage,  joint protection ed    Person(s) Educated Patient    Methods Explanation;Demonstration;Tactile cues;Verbal cues;Handout    Comprehension Verbal cues required;Returned demonstration;Verbalized understanding              OT Short Term Goals - 02/23/21 0938       OT SHORT TERM GOAL #1   Title Pt to be independent in HEP to decrease edema and pain with increase AROM in thumb , MC flexion and wrist    Baseline no knowledge - pain 5/10 and increase edema, 12/21 decreased pain and doing exercises, continuing to update exercises as she progresses    Time 3    Period Weeks    Status On-going    Target Date 03/11/21               OT Long Term Goals - 02/22/21 1913       OT LONG TERM GOAL #1   Title Pt's AROM in  L hand MC and composite flexion of digits and wrist AROM increase to WNL to use hand more than 50% in ADL's without increase symptoms    Baseline MC's decrease and composite flexion - 60's to 70's , wrist AROM decrease in all planes except RD - pain 5/10    Time 4    Period Weeks    Status Achieved  Target Date 01/10/21      OT LONG TERM GOAL #2   Title L thumb AROM increase to [facility] to type on computer, pick up cup and do buttons    Baseline L thumb AROM  impaired in all planes and joints and ont using thumb - pain rest 5/10    Time 5    Period Weeks    Status Achieved    Target Date 01/17/21      OT LONG TERM GOAL #3   Title L grip and prehension strength increase to more than 60% compare to R hand to pull ,push door, carry more than 5lbs and hold plate without increase symptoms    Baseline 3 1/2 wks - pain rest 5/10 - decrease AROM in all planes for digits, thumb and wrist -    Time 8    Period Weeks    Status Achieved    Target Date 02/07/21      OT LONG TERM GOAL #4   Title Pt to demonstrate understanding of joint protection principles with return to baking tasks with modified independence.    Baseline decreased knowledge of joint protection    Time 4    Period Weeks    Status New    Target Date 04/01/21                   Plan - 02/23/21 0936     Clinical Impression Statement Pt this date 62 wks s/p  - she had thumb CMC arthroplasty on 11/18/20  by Dr Rudene Christians. Pt has continued to progress well in all areas, goals partially met.  Increased exercises to 3 sets for 2 times a day.  No pain this date and has returned to some of her baking and cooking with modifications to activity.  AROM and strength have continued to improve with measurements.  Pt continues to benefit from skilled OT services to maximize safety and independence in ADL and IADL tasks.    OT Occupational Profile and History Problem Focused Assessment - Including review of records relating to presenting problem    Occupational performance deficits (Please refer to evaluation for details): ADL's;IADL's;Rest and Sleep;Work;Play;Social Participation;Leisure    Body Structure / Function / Physical Skills ADL;Decreased knowledge of precautions;Coordination;Flexibility;Sensation;IADL;ROM;Edema;Dexterity;Pain;Strength    Rehab Potential Good    Clinical Decision Making Limited treatment options, no task modification necessary    Comorbidities Affecting Occupational  Performance: None    Modification or Assistance to Complete Evaluation  No modification of tasks or assist necessary to complete eval    OT Frequency 1x / week    OT Duration 8 weeks    OT Treatment/Interventions Self-care/ADL training;DME and/or AE instruction;Fluidtherapy;Splinting;Contrast Bath;Therapeutic exercise;Scar mobilization;Paraffin;Manual Therapy;Patient/family education;Passive range of motion    Consulted and Agree with Plan of Care Patient             Patient will benefit from skilled therapeutic intervention in order to improve the following deficits and impairments:   Body Structure / Function / Physical Skills: ADL, Decreased knowledge of precautions, Coordination, Flexibility, Sensation, IADL, ROM, Edema, Dexterity, Pain, Strength       Visit Diagnosis: Stiffness of left hand, not elsewhere classified  Muscle weakness (generalized)  Localized edema  Pain in left hand    Problem List Patient Active Problem List   Diagnosis Date Noted   Lymphedema 01/14/2019   Essential hypertension 10/10/2018   Elevated blood pressure reading 01/07/2018   Elevated liver enzymes 01/07/2018   History of ulcerative colitis  01/07/2018   Pure hypercholesterolemia 09/28/2017   Type 2 diabetes mellitus without complication, without long-term current use of insulin ([place]) 09/28/2017   Acute upper respiratory infection 03/09/2010   Radial styloid tenosynovitis 02/15/2010   Sprain of MCL (medial collateral ligament) of knee 12/13/2009   Allergy 06/10/2009   Ulcerative colitis ([place]) 06/10/2009   Abdominal pain 08/26/2008   Pain in limb 06/17/2008   Dysmenorrhea 01/17/2008   Bronchitis, not specified as acute or chronic 01/28/2007   Connie Salazar, OTR/L, CLT  Connie Salazar, OT 02/23/2021, 7:15 PM  [place] [facility] [address], [place], [address] Phone: [phone]   Fax:  [phone]  Name: Connie Salazar MRN: [id number] Date of Birth: 04-16-1960

## 2021-03-08 ENCOUNTER — Ambulatory Visit: Payer: 59 | Attending: Orthopedic Surgery | Admitting: Occupational Therapy

## 2021-03-08 DIAGNOSIS — R600 Localized edema: Secondary | ICD-10-CM | POA: Diagnosis present

## 2021-03-08 DIAGNOSIS — M79642 Pain in left hand: Secondary | ICD-10-CM | POA: Insufficient documentation

## 2021-03-08 DIAGNOSIS — M6281 Muscle weakness (generalized): Secondary | ICD-10-CM | POA: Insufficient documentation

## 2021-03-08 DIAGNOSIS — M25642 Stiffness of left hand, not elsewhere classified: Secondary | ICD-10-CM | POA: Insufficient documentation

## 2021-03-08 NOTE — Therapy (Signed)
[place] [facility] [address], [place], [address] Phone: [phone]   Fax:  [phone]  Occupational Therapy Treatment  Patient Details  Name: Connie Salazar MRN: [id number] Date of Birth: 1960-03-01 Referring Provider (OT): Dr Rudene Christians   Encounter Date: 03/08/2021   OT End of Session - 03/08/21 1607     Visit Number 9    Number of Visits 9    Date for OT Re-Evaluation 03/08/21    OT Start Time [id number]    OT Stop Time 1600    OT Time Calculation (min) 45 min    Activity Tolerance Patient tolerated treatment well    Behavior During Therapy [facility] for tasks assessed/performed             Past Medical History:  Diagnosis Date   Asthma    changed states and has had no issues with asthma   Complication of anesthesia    woke up during 1st colonoscopy   Diabetes mellitus without complication ([place])    Type 2   Fatty liver    Hypertension    Ulcerative colitis ([place])     Past Surgical History:  Procedure Laterality Date   ABDOMINAL HYSTERECTOMY     CARPOMETACARPAL ([place]) FUSION OF THUMB Left 11/18/2020   Procedure: CARPOMETACARPAL [facility]) FUSION OF THUMB;  Surgeon: Hessie Knows, MD;  Location: [facility] ORS;  Service: Orthopedics;  Laterality: Left;   CHOLECYSTECTOMY     COLONOSCOPY      There were no vitals filed for this visit.   Subjective Assessment - 03/08/21 1521     Subjective  Using my hand in everything - I did bake about 3 wks ago and over done some things - thumb still goes numb at times-and pinkie/ring finger- otherwise using my sleeve but pumping about 3 x week my arm    Pertinent History Connie Salazar is a 61 y.o. female who was seen by Ortho on 12/02/20 - post left thumb CMC arthroplasty by Dr. Hessie Knows on 11/18/2020. Patient is doing well. Pain and swelling improving. Not taking much pain medicine at this time. She is wearing a thumb brace. No warmth erythema or drainage. Still little bit of numbness  along the tip of the thumb but this is improving  - refer to OT    Patient Stated Goals I want to be able to bake and do sewing with my hand - and thumb- get the motion and strength better    Currently in Pain? No/denies                [facility] OT Assessment - 03/08/21 0001       AROM   Left Wrist Extension 70 Degrees    Left Wrist Flexion 70 Degrees    Left Wrist Radial Deviation 22 Degrees    Left Wrist Ulnar Deviation 32 Degrees      Strength   Left Hand 3 Point Pinch 9 lbs             LYMPHEDEMA/ONCOLOGY QUESTIONNAIRE - 03/08/21 0001       Right Upper Extremity Lymphedema   15 cm Proximal to Olecranon Process 41.5 cm    10 cm Proximal to Olecranon Process 36.5 cm    Olecranon Process 28 cm    15 cm Proximal to Ulnar Styloid Process 27.5 cm    10 cm Proximal to Ulnar Styloid Process 24 cm    Just Proximal to Ulnar Styloid Process 16 cm  Left Upper Extremity Lymphedema   15 cm Proximal to Olecranon Process 41 cm    10 cm Proximal to Olecranon Process 39 cm    Olecranon Process 31.5 cm    15 cm Proximal to Ulnar Styloid Process 28.8 cm    10 cm Proximal to Ulnar Styloid Process 26 cm    Just Proximal to Ulnar Styloid Process 17 cm              Pt did not wear Medi Harmony sleeve  for 4 days when sick -using her pump only 3 x wk  Reinforce importance and if not wearing it again when sick  do pump at least and then get back with program as soon as she can - double on pump dong it 2 x day as order by her vascular MD      AROM for thumb and wrist WFL and strength 5th opposition decrease strength- report some numbness at times in 5th and 4th  Explain why grip same Pt ed on cubital tunnel symptoms -and position to prevent issues Assess AROM and strength -see assessment  Pt to monitor circumference of L UE   pt to cont to do composite extention of thumb off table  5 x day 10 reps  To prevent thumb collapsing into CT with pinching or sustained pinch or  grip              OT Education - 03/08/21 1606     Education Details discharge instructions    Person(s) Educated Patient    Methods Explanation;Demonstration;Tactile cues;Verbal cues;Handout    Comprehension Verbal cues required;Returned demonstration;Verbalized understanding              OT Short Term Goals - 03/08/21 1608       OT SHORT TERM GOAL #1   Title Pt to be independent in HEP to decrease edema and pain with increase AROM in thumb , MC flexion and wrist    Baseline Ind in HEP- AROM and strength in L hand WFL    Status Achieved               OT Long Term Goals - 03/08/21 1608       OT LONG TERM GOAL #1   Title Pt's AROM in  L hand MC and composite flexion of digits and wrist AROM increase to WNL to use hand more than 50% in ADL's without increase symptoms    Status Achieved      OT LONG TERM GOAL #2   Title L thumb AROM increase to [place] to type on computer, pick up cup and do buttons    Status Achieved      OT LONG TERM GOAL #3   Title L grip and prehension strength increase to more than 60% compare to R hand to pull ,push door, carry more than 5lbs and hold plate without increase symptoms    Status Achieved      OT LONG TERM GOAL #4   Title Pt to demonstrate understanding of joint protection principles with return to baking tasks with modified independence.    Baseline had now pain doing it the last few times    Status Achieved                   Plan - 03/08/21 1607     Clinical Impression Statement Pt more than 3 months s/p  - she had thumb CMC arthroplasty on 11/18/20  by Dr Rudene Christians.  Pt  with decrease pain in L thumb , increase AROM and strength in L hand and wrist - WFL. Pt do show thumb resting into palm - pt to rest hand open and work on composite extention of thumb off the table to decrease risk for compression of thumb into CT with tight or sustained pinch or grip. 3 point pinch increase but lat grip and grip same - pt show  weakness in 5th opposition and report at times numbness in 5th and 4th - ed pt on cubital tunnel symtoms and positioning of L UE. Can explain grip being the same. Pt report increase functional use of L hand without pain. Pt was fitted with new Medi Harmony sleeve to wear during day  and using her pump- her measurements increase this date- pt admit not using her sleever for 4 day when she was sick and using pump 3 x wk- ed pt use of pump and sleeve correctly to keep her lymphedema in L UE under control. Pt in agreement for discharge with homeprogram    OT Occupational Profile and History Problem Focused Assessment - Including review of records relating to presenting problem    Occupational performance deficits (Please refer to evaluation for details): ADL's;IADL's;Rest and Sleep;Work;Play;Social Participation;Leisure    Body Structure / Function / Physical Skills ADL;Decreased knowledge of precautions;Coordination;Flexibility;Sensation;IADL;ROM;Edema;Dexterity;Pain;Strength    Rehab Potential Good    Clinical Decision Making Limited treatment options, no task modification necessary    Comorbidities Affecting Occupational Performance: None    Modification or Assistance to Complete Evaluation  No modification of tasks or assist necessary to complete eval    OT Treatment/Interventions Self-care/ADL training;DME and/or AE instruction;Fluidtherapy;Splinting;Contrast Bath;Therapeutic exercise;Scar mobilization;Paraffin;Manual Therapy;Patient/family education;Passive range of motion    Consulted and Agree with Plan of Care Patient             Patient will benefit from skilled therapeutic intervention in order to improve the following deficits and impairments:   Body Structure / Function / Physical Skills: ADL, Decreased knowledge of precautions, Coordination, Flexibility, Sensation, IADL, ROM, Edema, Dexterity, Pain, Strength       Visit Diagnosis: Stiffness of left hand, not elsewhere  classified  Muscle weakness (generalized)  Localized edema  Pain in left hand    Problem List Patient Active Problem List   Diagnosis Date Noted   Lymphedema 01/14/2019   Essential hypertension 10/10/2018   Elevated blood pressure reading 01/07/2018   Elevated liver enzymes 01/07/2018   History of ulcerative colitis 01/07/2018   Pure hypercholesterolemia 09/28/2017   Type 2 diabetes mellitus without complication, without long-term current use of insulin ([place]) 09/28/2017   Acute upper respiratory infection 03/09/2010   Radial styloid tenosynovitis 02/15/2010   Sprain of MCL (medial collateral ligament) of knee 12/13/2009   Allergy 06/10/2009   Ulcerative colitis ([place]) 06/10/2009   Abdominal pain 08/26/2008   Pain in limb 06/17/2008   Dysmenorrhea 01/17/2008   Bronchitis, not specified as acute or chronic 01/28/2007    Rosalyn Gess, OTR/L,CLT 03/08/2021, 4:17 PM  [place] [facility] [address] [place], [address] Phone: [phone]   Fax:  [phone]  Name: Connie Salazar MRN: [id number] Date of Birth: [birth year]

## 2021-04-18 ENCOUNTER — Other Ambulatory Visit: Payer: Self-pay | Admitting: Internal Medicine

## 2021-04-18 DIAGNOSIS — R1011 Right upper quadrant pain: Secondary | ICD-10-CM

## 2021-04-22 ENCOUNTER — Other Ambulatory Visit: Payer: Self-pay

## 2021-04-22 ENCOUNTER — Ambulatory Visit
Admission: RE | Admit: 2021-04-22 | Discharge: 2021-04-22 | Disposition: A | Discharge status: Home / Self Care | Payer: 59 | Source: Ambulatory Visit | Attending: Internal Medicine | Admitting: Internal Medicine

## 2021-04-22 DIAGNOSIS — R1011 Right upper quadrant pain: Secondary | ICD-10-CM | POA: Diagnosis present

## 2021-04-26 ENCOUNTER — Other Ambulatory Visit: Payer: Self-pay | Admitting: Internal Medicine

## 2021-04-26 DIAGNOSIS — R1011 Right upper quadrant pain: Secondary | ICD-10-CM

## 2021-05-09 ENCOUNTER — Other Ambulatory Visit: Payer: Self-pay

## 2021-05-09 ENCOUNTER — Ambulatory Visit
Admission: RE | Admit: 2021-05-09 | Discharge: 2021-05-09 | Disposition: A | Discharge status: Home / Self Care | Payer: 59 | Source: Ambulatory Visit | Attending: Internal Medicine | Admitting: Internal Medicine

## 2021-05-09 DIAGNOSIS — R1011 Right upper quadrant pain: Secondary | ICD-10-CM | POA: Insufficient documentation

## 2021-05-09 MED ORDER — IOHEXOL 300 MG/ML  SOLN
100.0000 mL | Freq: Once | INTRAMUSCULAR | Status: AC | PRN
  Administered 2021-05-09: 100 mL via INTRAVENOUS

## 2021-06-15 ENCOUNTER — Other Ambulatory Visit: Payer: Self-pay | Admitting: Orthopedic Surgery

## 2021-06-23 ENCOUNTER — Encounter
Admission: RE | Admit: 2021-06-23 | Discharge: 2021-06-23 | Disposition: A | Discharge status: Home / Self Care | Payer: 59 | Source: Ambulatory Visit | Attending: Orthopedic Surgery | Admitting: Orthopedic Surgery

## 2021-06-23 NOTE — Patient Instructions (Addendum)
Your procedure is scheduled on: 07/07/21 Report to DAY SURGERY DEPARTMENT LOCATED ON 2ND FLOOR MEDICAL MALL ENTRANCE. To find out your arrival time please call [phone] between 1PM - 3PM on 07/06/21.  Remember: Instructions that are not followed completely may result in serious medical risk, up to and including death, or upon the discretion of your surgeon and anesthesiologist your surgery may need to be rescheduled.     _X__ 1. Do not eat food after midnight the night before your procedure.                 No gum chewing or hard candies. You may drink clear liquids up to 2 hours                 before you are scheduled to arrive for your surgery- DO not drink clear                 liquids within 2 hours of the start of your surgery.                  Diabetics water only   __X__2.  On the morning of surgery brush your teeth with toothpaste and water, you                 may rinse your mouth with mouthwash if you wish.  Do not swallow any              toothpaste of mouthwash.     _X__ 3.  No Alcohol for 24 hours before or after surgery.   _X__ 4.  Do Not Smoke or use e-cigarettes For 24 Hours Prior to Your Surgery.                 Do not use any chewable tobacco products for at least 6 hours prior to                 surgery.  ____  5.  Bring all medications with you on the day of surgery if instructed.   __X__  6.  Notify your doctor if there is any change in your medical condition      (cold, fever, infections).     Do not wear jewelry, make-up, hairpins, clips or nail polish. Do not wear lotions, powders, or perfumes. May wear deodorant Do not shave body hair 48 hours prior to surgery. Men may shave face and neck. Do not bring valuables to the hospital.    [facility] any belongings or valuables.  Contacts, dentures/partials or body piercings may not be worn into surgery. Bring a case for your contacts, glasses or hearing aids, a denture cup will be  supplied. Leave your suitcase in the car. After surgery it may be brought to your room. For patients admitted to the hospital, discharge time is determined by your treatment team.   Patients discharged the day of surgery will not be allowed to drive home.   Please read over the following fact sheets that you were given:     __X__ Take these medicines the morning of surgery with A SIP OF WATER:    1. atorvastatin (LIPITOR) 40 MG tablet  2.   3.   4.  5.  6.  ____ Fleet Enema (as directed)   __X__ Use CHG Soap/SAGE wipes as directed  ____ Use inhalers on the day of surgery  ____ Stop metformin/Janumet/Farxiga 2 days prior to surgery    ____  Take 1/2 of usual insulin dose the night before surgery. No insulin the morning          of surgery.   ____ Stop Blood Thinners Coumadin/Plavix/Xarelto/Pleta/Pradaxa/Eliquis/Effient/Aspirin  on   Or contact your Surgeon, Cardiologist or Medical Doctor regarding  ability to stop your blood thinners  __X__ Stop Anti-inflammatories 7 days before surgery such as Advil, Ibuprofen, Motrin,  BC or Goodies Powder, Naprosyn, Naproxen, Aleve, Aspirin   MAY TAKE TYLENOL   __X__ Stop all herbals and supplements, fish oil or vitamins for 7 days before surgery.    ____ Bring C-Pap to the hospital.      How to Use an Incentive Spirometer An incentive spirometer is a tool that measures how well you are filling your lungs with each breath. Learning to take long, deep breaths using this tool can help you keep your lungs clear and active. This may help to reverse or lessen your chance of developing breathing (pulmonary) problems, especially infection. You may be asked to use a spirometer: After a surgery. If you have a lung problem or a history of smoking. After a long period of time when you have been unable to move or be active. If the spirometer includes an indicator to show the highest number that you have reached, your health care provider or  respiratory therapist will help you set a goal. Keep a log of your progress as told by your health care provider. What are the risks? Breathing too quickly may cause dizziness or cause you to pass out. Take your time so you do not get dizzy or light-headed. If you are in pain, you may need to take pain medicine before doing incentive spirometry. It is harder to take a deep breath if you are having pain. How to use your incentive spirometer  Sit up on the edge of your bed or on a chair. Hold the incentive spirometer so that it is in an upright position. Before you use the spirometer, breathe out normally. Place the mouthpiece in your mouth. Make sure your lips are closed tightly around it. Breathe in slowly and as deeply as you can through your mouth, causing the piston or the ball to rise toward the top of the chamber. Hold your breath for 3-5 seconds, or for as long as possible. If the spirometer includes a coach indicator, use this to guide you in breathing. Slow down your breathing if the indicator goes above the marked areas. Remove the mouthpiece from your mouth and breathe out normally. The piston or ball will return to the bottom of the chamber. Rest for a few seconds, then repeat the steps 10 or more times. Take your time and take a few normal breaths between deep breaths so that you do not get dizzy or light-headed. Do this every 1-2 hours when you are awake. If the spirometer includes a goal marker to show the highest number you have reached (best effort), use this as a goal to work toward during each repetition. After each set of 10 deep breaths, cough a few times. This will help to make sure that your lungs are clear. If you have an incision on your chest or abdomen from surgery, place a pillow or a rolled-up towel firmly against the incision when you cough. This can help to reduce pain while taking deep breaths and coughing. General tips When you are able to get out of bed: Walk  around often. Continue to take deep breaths and cough in order to  clear your lungs. Keep using the incentive spirometer until your health care provider says it is okay to stop using it. If you have been in the hospital, you may be told to keep using the spirometer at home. Contact a health care provider if: You are having difficulty using the spirometer. You have trouble using the spirometer as often as instructed. Your pain medicine is not giving enough relief for you to use the spirometer as told. You have a fever. Get help right away if: You develop shortness of breath. You develop a cough with bloody mucus from the lungs. You have fluid or blood coming from an incision site after you cough. Summary An incentive spirometer is a tool that can help you learn to take long, deep breaths to keep your lungs clear and active. You may be asked to use a spirometer after a surgery, if you have a lung problem or a history of smoking, or if you have been inactive for a long period of time. Use your incentive spirometer as instructed every 1-2 hours while you are awake. If you have an incision on your chest or abdomen, place a pillow or a rolled-up towel firmly against your incision when you cough. This will help to reduce pain. Get help right away if you have shortness of breath, you cough up bloody mucus, or blood comes from your incision when you cough. This information is not intended to replace advice given to you by your health care provider. Make sure you discuss any questions you have with your health care provider. Document Revised: 05/05/2019 Document Reviewed: 05/05/2019 Elsevier Patient Education  2023 [employer].

## 2021-06-29 NOTE — Progress Notes (Signed)
?  Perioperative Services: Pre-Admission/Anesthesia Testing ? ?Abnormal Lab Notification ?  ? ?Date: 06/29/21 ? ?Name: Connie Salazar ?MRN:   [id number] ? ?Re: Abnormal labs noted during PAT appointment  ? ?Provider(s) Notified: Kennedy Bucker, MD ?Notification mode: Routed and/or faxed via [facility] ?  ?ABNORMAL LAB VALUE(S): ? Ref Range & Units 06/29/2021  ?Hemoglobin A1C 4.2 - 5.6 % 9.1 High    ?Average Blood Glucose (Calc) mg/dL 948   ?Resulting Agency  [facility] - [facility]  ? ?Notes: Patient with a T2DM diagnosis. She is currently on oral (glipizide and metformin) therapy. Last Hgb A1c was 9.1% on 06/29/2021. In efforts to reduce his risk of developing SSI/PJI, or other potential perioperative complications, this communication is being sent in order to determine if patient is deemed to have adequate medical optimization, including preoperative glycemic control.  ? ?The odds ratio for SSI/PJI infection is between 2.8 and 3.4 for orthopedic surgery patients with pre-operative serum glucose levels of > 125 mg/dL or a post-operative levels of > 200 mg/dL [facility] & Imogene Burn, 5462).   ? ?Data suggests that a Hgb A1c threshold of 7.7% tends to be more indicative of infection than the commonly used 7% and should perhaps be the pre-operative patient optimization goal (Tarabichi et al., 2017).  ? ?With that being said, the benefit of improving glycemic control must be weighed against the overall risk associated with delaying a necessary elective orthopedic surgery for this patient.  ? ?Citations: ?Hilbert Odor, A.F. Reducing the risk of infection after total joint arthroplasty: preoperative optimization. Arthroplasty 1, 4 (2019). [email or web address] ? ?Holley Raring MM, Adelani M, Brigati D, [place] SM, [address] [place] J[place], [employer], [place], [place], [place]. Determining the Threshold for HbA1c as a Predictor for Adverse Outcomes After Total Joint  Arthroplasty: A Multicenter, Retrospective Study. J Arthroplasty. 2017 Sep;32(9S):S263-S267.e1. [email or web address].  ? ?Quentin Mulling, MSN, APRN, FNP-C, CEN ?[facility]  ?Peri-operative Services Nurse Practitioner ?Phone: [phone] ?06/29/21 3:21 PM ? ?

## 2021-07-06 MED ORDER — FAMOTIDINE 20 MG PO TABS: 20.0000 mg | ORAL_TABLET | Freq: Once | ORAL | Status: AC

## 2021-07-06 MED ORDER — CHLORHEXIDINE GLUCONATE 0.12 % MT SOLN: 15.0000 mL | Freq: Once | OROMUCOSAL | Status: AC

## 2021-07-06 MED ORDER — CEFAZOLIN SODIUM-DEXTROSE 2-4 GM/100ML-% IV SOLN
2.0000 g | INTRAVENOUS | Status: AC
  Administered 2021-07-07: 2 g via INTRAVENOUS

## 2021-07-06 MED ORDER — SODIUM CHLORIDE 0.9 % IV SOLN: INTRAVENOUS | Status: DC

## 2021-07-06 MED ORDER — ORAL CARE MOUTH RINSE: 15.0000 mL | Freq: Once | OROMUCOSAL | Status: AC

## 2021-07-07 ENCOUNTER — Ambulatory Visit: Payer: 59 | Admitting: Urgent Care

## 2021-07-07 ENCOUNTER — Ambulatory Visit
Admission: RE | Admit: 2021-07-07 | Discharge: 2021-07-07 | Disposition: A | Discharge status: Home / Self Care | Payer: 59 | Attending: Orthopedic Surgery | Admitting: Orthopedic Surgery

## 2021-07-07 ENCOUNTER — Encounter: Payer: Self-pay | Admitting: Orthopedic Surgery

## 2021-07-07 ENCOUNTER — Encounter
Admission: RE | Disposition: A | Discharge status: Home / Self Care | Payer: Self-pay | Source: Home / Self Care | Attending: Orthopedic Surgery

## 2021-07-07 DIAGNOSIS — E119 Type 2 diabetes mellitus without complications: Secondary | ICD-10-CM | POA: Insufficient documentation

## 2021-07-07 DIAGNOSIS — S83232A Complex tear of medial meniscus, current injury, left knee, initial encounter: Secondary | ICD-10-CM | POA: Insufficient documentation

## 2021-07-07 DIAGNOSIS — M1712 Unilateral primary osteoarthritis, left knee: Secondary | ICD-10-CM | POA: Diagnosis not present

## 2021-07-07 DIAGNOSIS — X58XXXA Exposure to other specified factors, initial encounter: Secondary | ICD-10-CM | POA: Diagnosis not present

## 2021-07-07 DIAGNOSIS — M23242 Derangement of anterior horn of lateral meniscus due to old tear or injury, left knee: Secondary | ICD-10-CM | POA: Diagnosis not present

## 2021-07-07 DIAGNOSIS — I10 Essential (primary) hypertension: Secondary | ICD-10-CM | POA: Insufficient documentation

## 2021-07-07 DIAGNOSIS — Z7984 Long term (current) use of oral hypoglycemic drugs: Secondary | ICD-10-CM | POA: Diagnosis not present

## 2021-07-07 DIAGNOSIS — Z79899 Other long term (current) drug therapy: Secondary | ICD-10-CM | POA: Insufficient documentation

## 2021-07-07 HISTORY — PX: KNEE ARTHROSCOPY WITH LATERAL MENISECTOMY: SHX6193

## 2021-07-07 LAB — GLUCOSE, CAPILLARY
Glucose-Capillary: 167 mg/dL — ABNORMAL HIGH (ref 70–99)
Glucose-Capillary: 144 mg/dL — ABNORMAL HIGH (ref 70–99)

## 2021-07-07 SURGERY — ARTHROSCOPY, KNEE, WITH LATERAL MENISCECTOMY
Anesthesia: General | Site: Knee | Laterality: Left

## 2021-07-07 MED ORDER — HYDROCODONE-ACETAMINOPHEN 5-325 MG PO TABS: 1.0000 | ORAL_TABLET | ORAL | 0 refills | Status: DC | PRN

## 2021-07-07 MED ORDER — METOCLOPRAMIDE HCL 5 MG/ML IJ SOLN: 5.0000 mg | Freq: Three times a day (TID) | INTRAMUSCULAR | Status: DC | PRN

## 2021-07-07 MED ORDER — LIDOCAINE HCL (PF) 2 % IJ SOLN
INTRAMUSCULAR | Status: AC
Start: 2021-07-07 — End: ?
  Filled 2021-07-07: qty 5

## 2021-07-07 MED ORDER — BUPIVACAINE-EPINEPHRINE (PF) 0.5% -1:200000 IJ SOLN
INTRAMUSCULAR | Status: DC | PRN
  Administered 2021-07-07: 20 mL

## 2021-07-07 MED ORDER — BUPIVACAINE-EPINEPHRINE (PF) 0.5% -1:200000 IJ SOLN
INTRAMUSCULAR | Status: AC
  Filled 2021-07-07: qty 30

## 2021-07-07 MED ORDER — SODIUM CHLORIDE 0.9 % IV SOLN: INTRAVENOUS | Status: DC

## 2021-07-07 MED ORDER — KETAMINE HCL 50 MG/5ML IJ SOSY
PREFILLED_SYRINGE | INTRAMUSCULAR | Status: AC
  Filled 2021-07-07: qty 5

## 2021-07-07 MED ORDER — ONDANSETRON HCL 4 MG/2ML IJ SOLN: 4.0000 mg | Freq: Once | INTRAMUSCULAR | Status: DC | PRN

## 2021-07-07 MED ORDER — CEFAZOLIN SODIUM 1 G IJ SOLR
INTRAMUSCULAR | Status: AC
  Filled 2021-07-07: qty 20

## 2021-07-07 MED ORDER — ONDANSETRON HCL 4 MG/2ML IJ SOLN: 4.0000 mg | Freq: Four times a day (QID) | INTRAMUSCULAR | Status: DC | PRN

## 2021-07-07 MED ORDER — ACETAMINOPHEN 10 MG/ML IV SOLN: 1000.0000 mg | Freq: Once | INTRAVENOUS | Status: DC | PRN

## 2021-07-07 MED ORDER — FENTANYL CITRATE (PF) 100 MCG/2ML IJ SOLN
INTRAMUSCULAR | Status: DC | PRN
  Administered 2021-07-07: 25 ug via INTRAVENOUS
  Administered 2021-07-07: 50 ug via INTRAVENOUS
  Administered 2021-07-07: 25 ug via INTRAVENOUS

## 2021-07-07 MED ORDER — PROPOFOL 10 MG/ML IV BOLUS
INTRAVENOUS | Status: DC | PRN
  Administered 2021-07-07: 30 mg via INTRAVENOUS
  Administered 2021-07-07: 160 mg via INTRAVENOUS

## 2021-07-07 MED ORDER — METOCLOPRAMIDE HCL 10 MG PO TABS: 5.0000 mg | ORAL_TABLET | Freq: Three times a day (TID) | ORAL | Status: DC | PRN

## 2021-07-07 MED ORDER — FENTANYL CITRATE (PF) 100 MCG/2ML IJ SOLN
INTRAMUSCULAR | Status: AC
  Filled 2021-07-07: qty 2

## 2021-07-07 MED ORDER — MIDAZOLAM HCL 2 MG/2ML IJ SOLN
INTRAMUSCULAR | Status: DC | PRN
Start: 2021-07-07 — End: 2021-07-07
  Administered 2021-07-07: 2 mg via INTRAVENOUS

## 2021-07-07 MED ORDER — OXYCODONE HCL 5 MG PO TABS: 5.0000 mg | ORAL_TABLET | Freq: Once | ORAL | Status: DC | PRN

## 2021-07-07 MED ORDER — CHLORHEXIDINE GLUCONATE 0.12 % MT SOLN
OROMUCOSAL | Status: AC
  Administered 2021-07-07: 15 mL via OROMUCOSAL
  Filled 2021-07-07: qty 15

## 2021-07-07 MED ORDER — LACTATED RINGERS IR SOLN
Status: DC | PRN
  Administered 2021-07-07 (×2): 3000 mL

## 2021-07-07 MED ORDER — MIDAZOLAM HCL 2 MG/2ML IJ SOLN
INTRAMUSCULAR | Status: AC
  Filled 2021-07-07: qty 2

## 2021-07-07 MED ORDER — PROPOFOL 10 MG/ML IV BOLUS
INTRAVENOUS | Status: AC
  Filled 2021-07-07: qty 20

## 2021-07-07 MED ORDER — DEXAMETHASONE SODIUM PHOSPHATE 10 MG/ML IJ SOLN
INTRAMUSCULAR | Status: AC
  Filled 2021-07-07: qty 1

## 2021-07-07 MED ORDER — FAMOTIDINE 20 MG PO TABS
ORAL_TABLET | ORAL | Status: AC
  Administered 2021-07-07: 20 mg via ORAL
  Filled 2021-07-07: qty 1

## 2021-07-07 MED ORDER — ONDANSETRON HCL 4 MG/2ML IJ SOLN
INTRAMUSCULAR | Status: AC
  Filled 2021-07-07: qty 2

## 2021-07-07 MED ORDER — HYDROCODONE-ACETAMINOPHEN 7.5-325 MG PO TABS: 1.0000 | ORAL_TABLET | ORAL | Status: DC | PRN

## 2021-07-07 MED ORDER — FENTANYL CITRATE (PF) 100 MCG/2ML IJ SOLN: 25.0000 ug | INTRAMUSCULAR | Status: DC | PRN

## 2021-07-07 MED ORDER — DEXAMETHASONE SODIUM PHOSPHATE 10 MG/ML IJ SOLN
INTRAMUSCULAR | Status: DC | PRN
  Administered 2021-07-07: 4 mg via INTRAVENOUS

## 2021-07-07 MED ORDER — HYDROCODONE-ACETAMINOPHEN 5-325 MG PO TABS: 1.0000 | ORAL_TABLET | ORAL | Status: DC | PRN

## 2021-07-07 MED ORDER — LIDOCAINE HCL (CARDIAC) PF 100 MG/5ML IV SOSY
PREFILLED_SYRINGE | INTRAVENOUS | Status: DC | PRN
  Administered 2021-07-07: 100 mg via INTRAVENOUS

## 2021-07-07 MED ORDER — OXYCODONE HCL 5 MG/5ML PO SOLN: 5.0000 mg | Freq: Once | ORAL | Status: DC | PRN

## 2021-07-07 MED ORDER — MORPHINE SULFATE (PF) 2 MG/ML IV SOLN: 0.5000 mg | INTRAVENOUS | Status: DC | PRN

## 2021-07-07 MED ORDER — KETAMINE HCL 10 MG/ML IJ SOLN
INTRAMUSCULAR | Status: DC | PRN
  Administered 2021-07-07: 20 mg via INTRAVENOUS

## 2021-07-07 MED ORDER — ONDANSETRON HCL 4 MG PO TABS
4.0000 mg | ORAL_TABLET | Freq: Four times a day (QID) | ORAL | Status: DC | PRN
Start: 2021-07-07 — End: 2021-07-07

## 2021-07-07 MED ORDER — ACETAMINOPHEN 325 MG PO TABS: 325.0000 mg | ORAL_TABLET | Freq: Four times a day (QID) | ORAL | Status: DC | PRN

## 2021-07-07 MED ORDER — ONDANSETRON HCL 4 MG/2ML IJ SOLN
INTRAMUSCULAR | Status: DC | PRN
  Administered 2021-07-07: 4 mg via INTRAVENOUS

## 2021-07-07 SURGICAL SUPPLY — 33 items
APL PRP STRL LF DISP 70% ISPRP (MISCELLANEOUS) ×1
BLADE INCISOR PLUS 4.5 (BLADE) IMPLANT
BNDG ELASTIC 4X5.8 VLCR STR LF (GAUZE/BANDAGES/DRESSINGS) IMPLANT
CHLORAPREP W/TINT 26 (MISCELLANEOUS) ×2 IMPLANT
CUFF TOURN SGL QUICK 24 (TOURNIQUET CUFF)
CUFF TOURN SGL QUICK 34 (TOURNIQUET CUFF)
CUFF TRNQT CYL 24X4X16.5-23 (TOURNIQUET CUFF) IMPLANT
CUFF TRNQT CYL 34X4.125X (TOURNIQUET CUFF) IMPLANT
CUP MEDICINE 2OZ PLAST GRAD ST (MISCELLANEOUS) ×1 IMPLANT
DRAPE ARTHRO LIMB 89X125 STRL (DRAPES) ×2 IMPLANT
DRAPE C-ARMOR (DRAPES) IMPLANT
GAUZE SPONGE 4X4 12PLY STRL (GAUZE/BANDAGES/DRESSINGS) ×2 IMPLANT
GLOVE SURG SYN 9.0  PF PI (GLOVE) ×1
GLOVE SURG SYN 9.0 PF PI (GLOVE) ×1 IMPLANT
GOWN SRG 2XL LVL 4 RGLN SLV (GOWNS) ×1 IMPLANT
GOWN STRL NON-REIN 2XL LVL4 (GOWNS) ×2
GOWN STRL REUS W/ TWL LRG LVL3 (GOWN DISPOSABLE) ×2 IMPLANT
GOWN STRL REUS W/TWL LRG LVL3 (GOWN DISPOSABLE) ×4
IV LACTATED RINGER IRRG 3000ML (IV SOLUTION) ×4
IV LR IRRIG 3000ML ARTHROMATIC (IV SOLUTION) ×2 IMPLANT
KIT TURNOVER KIT A (KITS) ×2 IMPLANT
MANIFOLD NEPTUNE II (INSTRUMENTS) ×3 IMPLANT
NEEDLE HYPO 22GX1.5 SAFETY (NEEDLE) ×2 IMPLANT
PACK ARTHROSCOPY KNEE (MISCELLANEOUS) ×2 IMPLANT
SCALPEL PROTECTED #11 DISP (BLADE) ×2 IMPLANT
SPONGE T-LAP 18X18 ~~LOC~~+RFID (SPONGE) ×2 IMPLANT
SUT ETHILON 4-0 (SUTURE) ×2
SUT ETHILON 4-0 FS2 18XMFL BLK (SUTURE) ×1
SUTURE ETHLN 4-0 FS2 18XMF BLK (SUTURE) ×1 IMPLANT
TUBING INFLOW SET DBFLO PUMP (TUBING) ×2 IMPLANT
TUBING OUTFLOW SET DBLFO PUMP (TUBING) ×2 IMPLANT
WAND COBLATION FLOW 50 (SURGICAL WAND) IMPLANT
WATER STERILE IRR 500ML POUR (IV SOLUTION) ×2 IMPLANT

## 2021-07-07 NOTE — Op Note (Signed)
07/07/2021 ? ?1:47 PM ? ?PATIENT:  Connie Salazar  61 y.o. female ? ?PRE-OPERATIVE DIAGNOSIS:  Complex tear of medial meniscusof left knee as current injury, subsequent encounter  S83.232D ?Derangement of anterior horn of lateral meniscus of left knee due to old injury  M23.242 ?Primary osteoarthritis of left knee M17.12 ? ?POST-OPERATIVE DIAGNOSIS:  medial and lateral meniscus tears, left knee ? ?PROCEDURE:  Procedure(s): ?Left knee arthroscopy, partial medial and lateral menisectomy (Left) ? ?SURGEON: Leitha Schuller, MD ? ?ASSISTANTS: None ? ?ANESTHESIA:   general ? ?EBL:  Total I/O ?In: 500 [I.V.:400; IV Piggyback:100] ?Out: 5 [Blood:5] ? ?BLOOD ADMINISTERED:none ? ?DRAINS: none  ? ?LOCAL MEDICATIONS USED:  MARCAINE    ? ?SPECIMEN:  No Specimen ? ?DISPOSITION OF SPECIMEN:  N/A ? ?COUNTS:  YES ? ?TOURNIQUET:  * Missing tourniquet times found for documented tourniquets in log: [id number] * ? ?IMPLANTS: None ? ?DICTATION: .Dragon Dictation patient was brought to the operating room and after adequate anesthesia was obtained the left leg was prepped and draped you sterile fashion with a tourniquet applied to the upper thigh but not inflated during the procedure.  After patient identification and timeout procedures after the arthroscopy equipment was set an inferior lateral portal was made and the arthroscope was introduced.  Initial inspection revealed loose cartilage fragments not large enough to become there is loose bodies floating in the suprapatellar pouch with moderate synovitis the gutters were checked and there were no loose bodies currently medial compartment for medial portal was made and on probing there was a tear consistent with the MRI findings at the root medially as well as extensive degenerative changes with some exposed bone on both sides with fissuring as well on the tibial and femoral condyles.  This was addressed with ArthroCare wand to ablate back to a stable margin at the root medially as well as the  posterior horn also had degenerative tear consistent with prior preop MRI.  ACL was intact going the lateral compartment there was flap tears to the anterior and posterior thirds of the meniscus which were ablated again with the ArthroCare wand.  Patellofemoral joint had significant degenerative changes on both sides of the joint at this point the knee was thoroughly irrigated with outflow cannula through the inferomedial portal to allow for egress of these loose cartilage fragments as well as debriding some of the synovium that seem to impinge at the patellofemoral joint.  After this was completed the wound was thoroughly irrigated and all instrumentation withdrawn.  The wounds were closed with simple interrupted 4-0 nylon skin sutures with 10 cc of half percent Sensorcaine infiltrated to both incisions for postop analgesia.  Xeroform 4 x 4's web roll and Ace wrap applied loosely were then applied. ? ?PLAN OF CARE: Discharge to home after PACU ? ?PATIENT DISPOSITION:  PACU - hemodynamically stable. ?  ? ?

## 2021-07-07 NOTE — Transfer of Care (Signed)
Immediate Anesthesia Transfer of Care Note ? ?Patient: Connie Salazar ? ?Procedure(s) Performed: Left knee arthroscopy, partial medial and lateral menisectomy (Left: Knee) ? ?Patient Location: PACU ? ?Anesthesia Type:General ? ?Level of Consciousness: drowsy ? ?Airway & Oxygen Therapy: Patient Spontanous Breathing and Patient connected to face mask oxygen ? ?Post-op Assessment: Report given to RN and Post -op Vital signs reviewed and stable ? ?Post vital signs: Reviewed and stable ? ?Last Vitals:  ?Vitals Value Taken Time  ?BP 139/78 07/07/21 1346  ?Temp    ?Pulse 73 07/07/21 1348  ?Resp 18 07/07/21 1348  ?SpO2 99 % 07/07/21 1348  ?Vitals shown include unvalidated device data. ? ?Last Pain:  ?Vitals:  ? 07/07/21 0954  ?TempSrc: Temporal  ?PainSc: 5   ?   ? ?  ? ?Complications: No notable events documented. ?

## 2021-07-07 NOTE — H&P (Signed)
Chief Complaint  ?Patient presents with  ? Left Knee - Follow-up  ? ? ?History of the Present Illness: ?Connie Salazar is a 61 y.o. female here today.  ? ?The patient presents for evaluation of left knee pain. The patient had a prior [facility] arthroplasty. She had an MRI on 10/29/2020 showing a large medial meniscus tear as well as a possible degenerative tear to the lateral meniscus, significant patellofemoral degenerative change, as well as medial and lateral degenerative changes. No tumor. No aggressive lesion. ? ?The patient states her left knee is not getting any better. She locates her pain to the anteromedial aspect of her left knee. She states the swelling has not gone down. She has iced, heat, and elevated her left knee when she got home from work and even at work. She notes she was country western dancing when she injured her knee. ? ?The patient states her left hand is still swollen and painful. She is still doing the exercises with [employer], OTR/L, CLT, and she is wearing her brace. She states she is much better before surgery. She states she cannot wear heels, and she can barely wear her cowboy boots. She states she has a hard time getting them up due to the swelling. ? ?The patient has no history of blood clots. ? ?The patient is employed at a desk job. ? ?I have reviewed past medical, surgical, social and family history, and allergies as documented in the EMR. ? ?Past Medical History: ?Past Medical History:  ?Diagnosis Date  ? Diabetes mellitus type 2, uncomplicated (CMS-HCC)  ? Hyperlipidemia  ? Ulcerative colitis (CMS-HCC)  ? ?Past Surgical History: ?Past Surgical History:  ?Procedure Laterality Date  ? Left thumb [facility] 11/18/2020  ?By Dr. Rudene Christians  ? CHOLECYSTECTOMY  ? HYSTERECTOMY  ? ?Past Family History: ?Family History  ?Problem Relation Age of Onset  ? Breast cancer Mother  ? No Known Problems Father  ? No Known Problems Sister  ? ?Medications: ?Current Outpatient Medications Ordered in Epic   ?Medication Sig Dispense Refill  ? ACCU-CHEK FASTCLIX LANCET DRUM Use 1 each 2 (two) times daily Use as instructed. 100 each 5  ? ACCU-CHEK GUIDE TEST STRIPS test strip 1 each (1 strip total) 2 (two) times daily Use as instructed. 100 each 5  ? acetaminophen (TYLENOL) 500 MG tablet Take 1,000 mg by mouth 2 (two) times daily as needed for Pain  ? ascorbic acid, vitamin C, (VITAMIN C) 1000 MG tablet Take 1 tablet by mouth once daily  ? atorvastatin (LIPITOR) 40 MG tablet TAKE 1 TABLET BY MOUTH EVERY DAY 90 tablet 1  ? blood glucose diagnostic test strip Use 1 each (1 strip total) 2 (two) times daily Use as instructed. 100 each 12  ? cholecalciferol (VITAMIN D3) 2,000 unit tablet Take 1 tablet by mouth once daily  ? glipiZIDE (GLUCOTROL) 5 MG tablet Take 1 tablet (5 mg total) by mouth 2 (two) times daily before meals (Patient taking differently: Take 5 mg by mouth 2 (two) times daily before meals Take 2 tabs in am and 1 pm) 180 tablet 3  ? HYDROcodone-acetaminophen (NORCO) 5-325 mg tablet Take 1-2 tablets by mouth every 4 (four) hours  ? lisinopriL (ZESTRIL) 5 MG tablet TAKE 1 TABLET BY MOUTH EVERY DAY 90 tablet 1  ? metFORMIN (GLUCOPHAGE) 1000 MG tablet Take 1 tablet (1,000 mg total) by mouth 2 (two) times daily with meals 180 tablet 3  ? blood glucose meter kit Use as directed 1  each 0  ? lancing device with lancets kit Use 1 each 2 (two) times daily Use as instructed. 100 each 11  ? meloxicam (MOBIC) 7.5 MG tablet Take 1 tablet (7.5 mg total) by mouth once daily (Patient not taking: Reported on 06/13/2021) 30 tablet 1  ? ?No current Epic-ordered facility-administered medications on file.  ? ?Allergies: ?Allergies  ?Allergen Reactions  ? Asacol [Mesalamine] Abdominal Pain  ? ? ?Body mass index is 37.77 kg/m?. ? ?Review of Systems: ?A comprehensive 14 point ROS was performed, reviewed, and the pertinent orthopaedic findings are documented in the HPI. ? ?Vitals:  ?06/13/21 1420  ?BP: 134/78  ? ? ?General Physical  Examination:  ? ?General/Constitutional: No apparent distress: well-nourished and well developed. ?Eyes: Pupils equal, round with synchronous movement. ?Lungs: Clear to auscultation ?HEENT: Normal ?Vascular: No edema, swelling or tenderness, except as noted in detailed exam. ?Cardiac: Heart rate and rhythm is regular. ?Integumentary: No impressive skin lesions present, except as noted in detailed exam. ?Neuro/Psych: Normal mood and affect, oriented to person, place and time. ? ?On exam, tenderness to the left knee joint line. Left knee has full extension and flexion to 115 degrees. Positive medial and lateral [place] on the left. No instability. Moderate effusion with some synovitis also present. ? ?Right hand edema. ? ?Radiographs: ? ?No new imaging studies were obtained today. ? ?Assessment: ?ICD-10-CM  ?1. Complex tear of medial meniscus of left knee as current injury, subsequent encounter S83.232D  ?2. Derangement of anterior horn of lateral meniscus of left knee due to old injury M23.242  ?3. Primary osteoarthritis of left knee M17.12  ?4. Primary osteoarthritis of first carpometacarpal joint of left hand M18.12  ? ?Plan: ? ?The patient has clinical findings of left knee medial and lateral meniscus tears. ? ?We discussed the patient's prior MRI findings. I explained she has more arthritis on her MRI than on her x-ray. I recommend left knee arthroscopy. I explained the surgery and postoperative course in detail. The patient viewed the [facility] video on knee arthroscopy. ? ?We will schedule the patient for left knee arthroscopy in the next 2 weeks. ? ?Surgical Risks: ? ?The nature of the condition and the proposed procedure has been reviewed in detail with the patient. Surgical versus non-surgical options and prognosis for recovery have been reviewed and the inherent risks and benefits of each have been discussed including the risks of infection, bleeding, injury to nerves/blood vessels/tendons, incomplete relief of  symptoms, persisting pain and/or stiffness, loss of function, complex regional pain syndrome, failure of the procedure, as appropriate. ? ?Document Attestation: ?I, Sowjanya Panditi, have reviewed and updated documentation for [facility], MD, utilizing [employer] D[employer].  ? ?Electronically signed by Lauris Poag, MD at 06/14/2021 2:10 PM EDT ? ?Reviewed  H+P. ?No changes noted. ? ? ?

## 2021-07-07 NOTE — Discharge Instructions (Addendum)
Keep dressing clean and dry ?Okay to loosen Ace wrap if it feels too tight ?Weightbearing as tolerated ?Try to minimize activity throughout the weekend ?Pain medicine as directed ?Call office if having problems ? ?AMBULATORY SURGERY  ?DISCHARGE INSTRUCTIONS ? ? ?The drugs that you were given will stay in your system until tomorrow so for the next 24 hours you should not: ? ?Drive an automobile ?Make any legal decisions ?Drink any alcoholic beverage ? ? ?You may resume regular meals tomorrow.  Today it is better to start with liquids and gradually work up to solid foods. ? ?You may eat anything you prefer, but it is better to start with liquids, then soup and crackers, and gradually work up to solid foods. ? ? ?Please notify your doctor immediately if you have any unusual bleeding, trouble breathing, redness and pain at the surgery site, drainage, fever, or pain not relieved by medication. ? ? ? ?Additional Instructions: ? ? ?Please contact your physician with any problems or Same Day Surgery at [phone], Monday through Friday 6 am to 4 pm, or [place] at [facility] number at [phone]. ?

## 2021-07-07 NOTE — Anesthesia Preprocedure Evaluation (Signed)
Anesthesia Evaluation  ?Patient identified by MRN, date of birth, ID band ?Patient awake ? ? ? ?Reviewed: ?Allergy & Precautions, NPO status , Patient's Chart, lab work & pertinent test results ? ?History of Anesthesia Complications ?Negative for: history of anesthetic complications ? ?Airway ?Mallampati: II ? ?TM Distance: >3 FB ?Neck ROM: Full ? ? ? Dental ? ?(+) Loose, Poor Dentition, Missing ?"all teeth loose" ?None severely loose:   ?Pulmonary ?asthma , neg sleep apnea, neg COPD, Patient abstained from smoking.Not current smoker,  ?  ?Pulmonary exam normal ?breath sounds clear to auscultation ? ? ? ? ? ? Cardiovascular ?Exercise Tolerance: Good ?METShypertension, Pt. on medications ?(-) CAD and (-) Past MI (-) dysrhythmias  ?Rhythm:Regular Rate:Normal ?- Systolic murmurs ? ?  ?Neuro/Psych ?negative neurological ROS ? negative psych ROS  ? GI/Hepatic ?PUD, neg GERD  ,(+)  ?  ? (-) substance abuse ? ,   ?Endo/Other  ?diabetes, Oral Hypoglycemic Agents ? Renal/GU ?negative Renal ROS  ? ?  ?Musculoskeletal ? ? Abdominal ?  ?Peds ? Hematology ?  ?Anesthesia Other Findings ?Past Medical History: ?No date: Asthma ?    Comment:  changed states and has had no issues with asthma ?No date: Complication of anesthesia ?    Comment:  woke up during 1st colonoscopy ?No date: Diabetes mellitus without complication ([place]) ?    Comment:  Type 2 ?No date: Fatty liver ?No date: Hypertension ?No date: Ulcerative colitis ([place]) ? Reproductive/Obstetrics ? ?  ? ? ? ? ? ? ? ? ? ? ? ? ? ?  ?  ? ? ? ? ? ? ? ? ?Anesthesia Physical ?Anesthesia Plan ? ?ASA: 2 ? ?Anesthesia Plan: General  ? ?Post-op Pain Management: Ofirmev IV (intra-op)*  ? ?Induction: Intravenous ? ?PONV Risk Score and Plan: 3 and Ondansetron, Dexamethasone and Midazolam ? ?Airway Management Planned: LMA ? ?Additional Equipment: None ? ?Intra-op Plan:  ? ?Post-operative Plan: Extubation in OR ? ?Informed Consent: I have reviewed the  patients History and Physical, chart, labs and discussed the procedure including the risks, benefits and alternatives for the proposed anesthesia with the patient or authorized representative who has indicated his/her understanding and acceptance.  ? ? ? ?Dental advisory given ? ?Plan Discussed with: CRNA and Surgeon ? ?Anesthesia Plan Comments: (Discussed risks of anesthesia with patient, including PONV, sore throat, lip/dental/eye damage. Rare risks discussed as well, such as cardiorespiratory and neurological sequelae, and allergic reactions. Discussed the role of CRNA in patient's perioperative care. Patient understands.)  ? ? ? ? ? ? ?Anesthesia Quick Evaluation ? ?

## 2021-07-07 NOTE — Anesthesia Postprocedure Evaluation (Signed)
Anesthesia Post Note ? ?Patient: Lanora Reveron ? ?Procedure(s) Performed: Left knee arthroscopy, partial medial and lateral menisectomy (Left: Knee) ? ?Patient location during evaluation: PACU ?Anesthesia Type: General ?Level of consciousness: awake and alert ?Pain management: pain level controlled ?Vital Signs Assessment: post-procedure vital signs reviewed and stable ?Respiratory status: spontaneous breathing, nonlabored ventilation, respiratory function stable and patient connected to nasal cannula oxygen ?Cardiovascular status: blood pressure returned to baseline and stable ?Postop Assessment: no apparent nausea or vomiting ?Anesthetic complications: no ? ? ?No notable events documented. ? ? ?Last Vitals:  ?Vitals:  ? 07/07/21 1345 07/07/21 1400  ?BP: 139/78 (!) 144/77  ?Pulse: 72 71  ?Resp: 15 13  ?Temp: 36.4 ?C   ?SpO2: 98% 97%  ?  ?Last Pain:  ?Vitals:  ? 07/07/21 1345  ?TempSrc:   ?PainSc: Asleep  ? ? ?  ?  ?  ?  ?  ?  ? ?Corinda Gubler ? ? ? ? ?

## 2021-07-08 ENCOUNTER — Encounter: Payer: Self-pay | Admitting: Orthopedic Surgery

## 2021-12-26 ENCOUNTER — Encounter (INDEPENDENT_AMBULATORY_CARE_PROVIDER_SITE_OTHER): Payer: Self-pay

## 2021-12-27 ENCOUNTER — Ambulatory Visit (INDEPENDENT_AMBULATORY_CARE_PROVIDER_SITE_OTHER): Payer: 59 | Admitting: Vascular Surgery

## 2021-12-27 ENCOUNTER — Encounter (INDEPENDENT_AMBULATORY_CARE_PROVIDER_SITE_OTHER): Payer: Self-pay | Admitting: Vascular Surgery

## 2022-01-04 ENCOUNTER — Other Ambulatory Visit: Payer: Self-pay | Admitting: Internal Medicine

## 2022-01-04 DIAGNOSIS — Z1231 Encounter for screening mammogram for malignant neoplasm of breast: Secondary | ICD-10-CM

## 2022-01-12 ENCOUNTER — Ambulatory Visit
Admission: RE | Admit: 2022-01-12 | Discharge: 2022-01-12 | Disposition: A | Discharge status: Home / Self Care | Payer: 59 | Source: Ambulatory Visit | Attending: Internal Medicine | Admitting: Internal Medicine

## 2022-01-12 DIAGNOSIS — Z1231 Encounter for screening mammogram for malignant neoplasm of breast: Secondary | ICD-10-CM | POA: Insufficient documentation

## 2022-01-24 ENCOUNTER — Ambulatory Visit (INDEPENDENT_AMBULATORY_CARE_PROVIDER_SITE_OTHER): Payer: 59 | Admitting: Vascular Surgery

## 2022-01-24 ENCOUNTER — Encounter (INDEPENDENT_AMBULATORY_CARE_PROVIDER_SITE_OTHER): Payer: Self-pay | Admitting: Vascular Surgery

## 2022-01-24 VITALS — BP 128/82 | HR 75 | Resp 18 | Ht 65.0 in | Wt 225.2 lb

## 2022-01-24 DIAGNOSIS — I890 Lymphedema, not elsewhere classified: Secondary | ICD-10-CM

## 2022-01-24 DIAGNOSIS — E119 Type 2 diabetes mellitus without complications: Secondary | ICD-10-CM

## 2022-01-24 DIAGNOSIS — I10 Essential (primary) hypertension: Secondary | ICD-10-CM | POA: Diagnosis not present

## 2022-01-24 NOTE — Assessment & Plan Note (Signed)
Left arm lymphedema symptoms are roughly stable despite not being able to use her sleeve or pump as regularly over the past several months.  She is going to get back to using those now that her bedbug infestation is gone and extermination have been completed.  Plan on seeing her in 1 year.  Contact our office with problems in the interim

## 2022-01-24 NOTE — Progress Notes (Signed)
MRN : [id number]  Connie Salazar is a 61 y.o. (1960/06/03) female who presents with chief complaint of No chief complaint on file. Marland Kitchen  History of Present Illness: Patient returns today in follow up of her lymphedema of her left arm.  She underwent left knee surgery earlier this year, but has not really noticed any increase in swelling in either her left arm or her legs.  Her swelling is been stable despite not being able to use her lymphedema sleeve or her pump due to bedbug infestation after a vacation in April.  She has been pleasantly surprised by her lack of symptoms despite her typical therapies.  She is in good spirits and has no new complaints today.  Current Outpatient Medications  Medication Sig Dispense Refill   acetaminophen (TYLENOL) 500 MG tablet Take 1,000 mg by mouth every 8 (eight) hours as needed for moderate pain.     Ascorbic Acid (VITAMIN C) 1000 MG tablet Take 1,000 mg by mouth daily.     atorvastatin (LIPITOR) 40 MG tablet Take 40 mg by mouth every morning.     Cholecalciferol (VITAMIN D) 50 MCG (2000 UT) tablet Take 2,000 Units by mouth daily.     diphenhydrAMINE (BENADRYL) 25 MG tablet Take 50 mg by mouth every 12 (twelve) hours as needed for allergies.     glipiZIDE (GLUCOTROL) 5 MG tablet Take 5 mg by mouth 2 (two) times daily before a meal.     lisinopril (ZESTRIL) 5 MG tablet Take 5 mg by mouth every morning.     metFORMIN (GLUCOPHAGE) 1000 MG tablet Take 1,000 mg by mouth 2 (two) times daily.     propranolol (INDERAL) 10 MG tablet Take 10 mg by mouth 2 (two) times daily.     Zinc 30 MG TABS Take 30 mg by mouth daily.     No current facility-administered medications for this visit.    Past Medical History:  Diagnosis Date   Asthma    changed states and has had no issues with asthma   Complication of anesthesia    woke up during 1st colonoscopy   Diabetes mellitus without complication (HCC)    Type 2   Fatty liver    Hypertension    Ulcerative colitis  (HCC)     Past Surgical History:  Procedure Laterality Date   ABDOMINAL HYSTERECTOMY     CARPOMETACARPAL ([facility]) FUSION OF THUMB Left 11/18/2020   Procedure: CARPOMETACARPAL [facility]) FUSION OF THUMB;  Surgeon: Kennedy Bucker, MD;  Location: [facility] ORS;  Service: Orthopedics;  Laterality: Left;   CHOLECYSTECTOMY     COLONOSCOPY     KNEE ARTHROSCOPY WITH LATERAL MENISECTOMY Left 07/07/2021   Procedure: Left knee arthroscopy, partial medial and lateral menisectomy;  Surgeon: Kennedy Bucker, MD;  Location: [facility] ORS;  Service: Orthopedics;  Laterality: Left;     Social History   Tobacco Use   Smoking status: Never   Smokeless tobacco: Never  Vaping Use   Vaping Use: Never used  Substance Use Topics   Alcohol use: Yes    Alcohol/week: 1.0 standard drink of alcohol    Types: 1 Glasses of wine per week    Comment: wine ocassionally   Drug use: Never       Family History  Problem Relation Age of Onset   Breast cancer Mother 51     Allergies  Allergen Reactions   Nsaids Other (See Comments)    Ulcerative colitis   Mesalamine Anxiety    Abdominal Pain  REVIEW OF SYSTEMS (Negative unless checked)  Constitutional: [] Weight loss  [] Fever  [] Chills Cardiac: [] Chest pain   [] Chest pressure   [] Palpitations   [] Shortness of breath when laying flat   [] Shortness of breath at rest   [] Shortness of breath with exertion. Vascular:  [] Pain in legs with walking   [] Pain in legs at rest   [] Pain in legs when laying flat   [] Claudication   [] Pain in feet when walking  [] Pain in feet at rest  [] Pain in feet when laying flat   [] History of DVT   [] Phlebitis   [] Swelling in legs   [] Varicose veins   [] Non-healing ulcers Pulmonary:   [] Uses home oxygen   [] Productive cough   [] Hemoptysis   [] Wheeze  [] COPD   [] Asthma Neurologic:  [] Dizziness  [] Blackouts   [] Seizures   [] History of stroke   [] History of TIA  [] Aphasia   [] Temporary blindness   [] Dysphagia   [] Weakness or numbness in arms   [] Weakness  or numbness in legs Musculoskeletal:  [x] Arthritis   [] Joint swelling   [] Joint pain   [] Low back pain Hematologic:  [] Easy bruising  [] Easy bleeding   [] Hypercoagulable state   [] Anemic   Gastrointestinal:  [] Blood in stool   [] Vomiting blood  [] Gastroesophageal reflux/heartburn   [] Abdominal pain Genitourinary:  [] Chronic kidney disease   [] Difficult urination  [] Frequent urination  [] Burning with urination   [] Hematuria Skin:  [] Rashes   [] Ulcers   [] Wounds Psychological:  [] History of anxiety   []  History of major depression.  Physical Examination  BP 128/82 (BP Location: Right Arm)   Pulse 75   Resp 18   Ht 5\' 5"  (1.651 m)   Wt 225 lb 3.2 oz (102.2 kg)   BMI 37.48 kg/m  Gen:  WD/WN, NAD Head: [place]/AT, No temporalis wasting. Ear/Nose/Throat: Hearing grossly intact, nares w/o erythema or drainage Eyes: Conjunctiva clear. Sclera non-icteric Neck: Supple.  Trachea midline Pulmonary:  Good air movement, no use of accessory muscles.  Cardiac: RRR, no JVD Vascular:  Vessel Right Left  Radial Palpable Palpable           Musculoskeletal: M/S 5/5 throughout.  No deformity or atrophy.  1+ left upper extremity edema.  No significant lower extremity edema Neurologic: Sensation grossly intact in extremities.  Symmetrical.  Speech is fluent.  Psychiatric: Judgment intact, Mood & affect appropriate for pt's clinical situation. Dermatologic: No rashes or ulcers noted.  No cellulitis or open wounds.      Labs No results found for this or any previous visit (from the past 2160 hour(s)).  Radiology MM 3D SCREEN BREAST BILATERAL  Result Date: 01/13/2022 CLINICAL DATA:  Screening. EXAM: DIGITAL SCREENING BILATERAL MAMMOGRAM WITH TOMOSYNTHESIS AND CAD TECHNIQUE: Bilateral screening digital craniocaudal and mediolateral oblique mammograms were obtained. Bilateral screening digital breast tomosynthesis was performed. The images were evaluated with computer-aided detection. COMPARISON:   Previous exam(s). ACR Breast Density Category c: The breast tissue is heterogeneously dense, which may obscure small masses. FINDINGS: There are no findings suspicious for malignancy. IMPRESSION: No mammographic evidence of malignancy. A result letter of this screening mammogram will be mailed directly to the patient. RECOMMENDATION: Screening mammogram in one year. (Code:SM-B-01Y) BI-RADS CATEGORY  1: Negative. Electronically Signed   By: M.D.   On: 01/13/2022 17:39    Assessment/Plan Type 2 diabetes mellitus without complication, without long-term current use of insulin (HCC) blood glucose control important in reducing the progression of atherosclerotic disease. Also, involved in wound healing. On  appropriate medications.     Essential hypertension blood pressure control important in reducing the progression of atherosclerotic disease. On appropriate oral medications.  Lymphedema Left arm lymphedema symptoms are roughly stable despite not being able to use her sleeve or pump as regularly over the past several months.  She is going to get back to using those now that her bedbug infestation is gone and extermination have been completed.  Plan on seeing her in 1 year.  Contact our office with problems in the interim    Festus Barren, MD  01/24/2022 3:26 PM    This note was created with Dragon medical transcription system.  Any errors from dictation are purely unintentional

## 2022-10-15 IMAGING — CT CT ABD-PELV W/ CM
2 of 5 series · 16 of 46 positions shown, 18 images · IV contrast (APPLIED)
Comparison: Ultrasound 04/22/2021

CLINICAL DATA: Right upper quadrant pain

EXAM:
CT ABDOMEN AND PELVIS WITH CONTRAST
TECHNIQUE: Multidetector CT imaging of the abdomen and pelvis was performed
using the standard protocol following bolus administration of
intravenous contrast.

[Series 2: routine abd/pel with · axial · 0.83mm/px · z∈[-982,-507]mm · 13 of 107 slices shown, 15 images]
[im 6/107  soft-tissue]
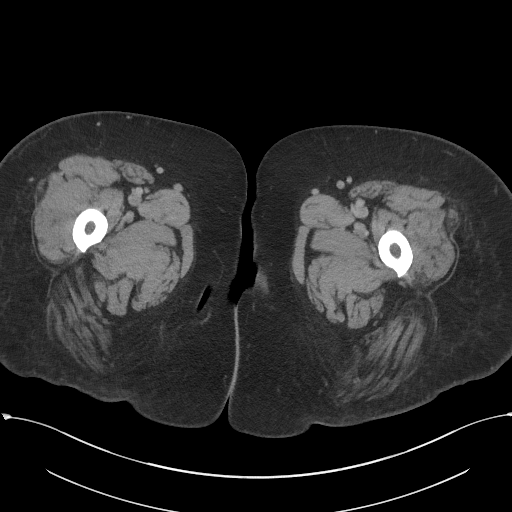
[im 6/107  bone]
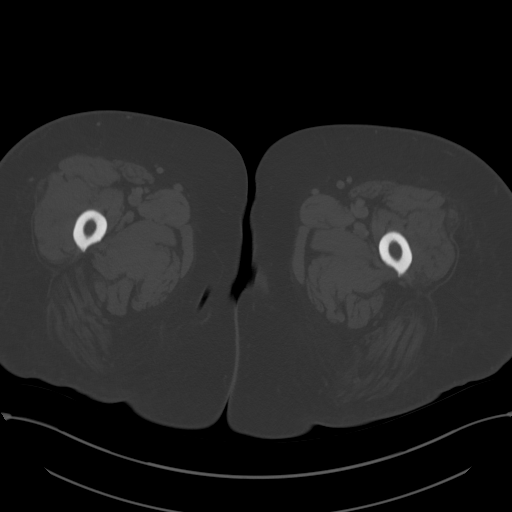
[im 12/107  soft-tissue]
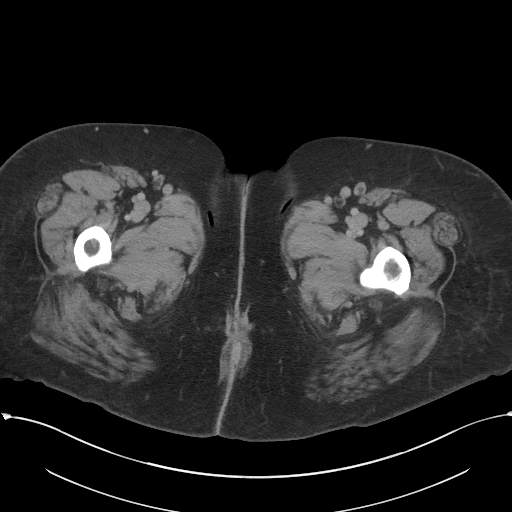
[im 24/107  soft-tissue]
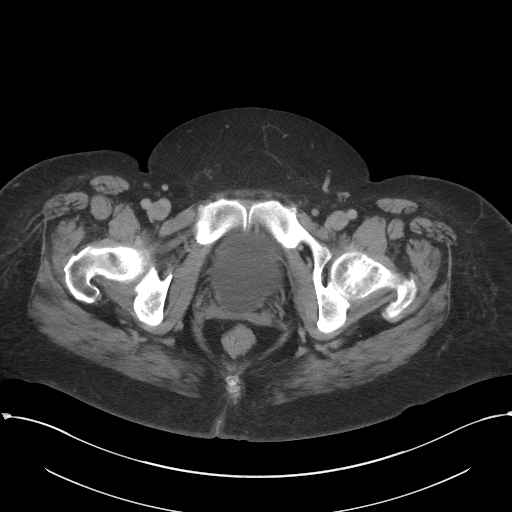
[im 30/107  soft-tissue]
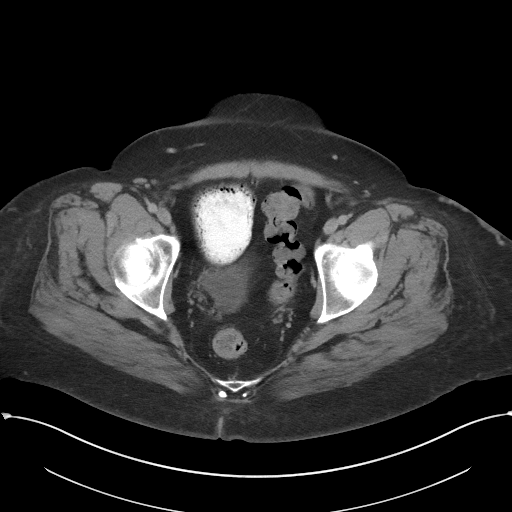
[im 36/107  soft-tissue]
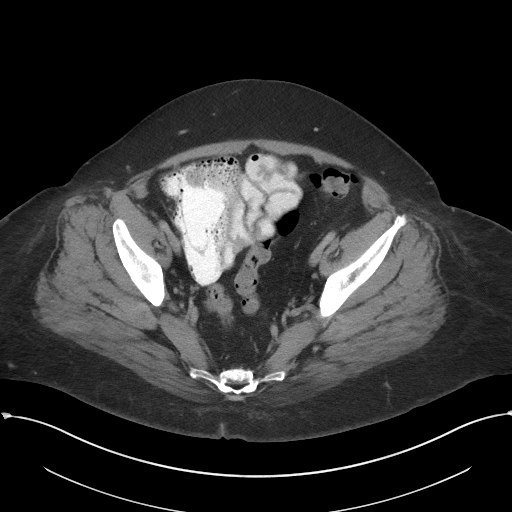
[im 48/107  soft-tissue]
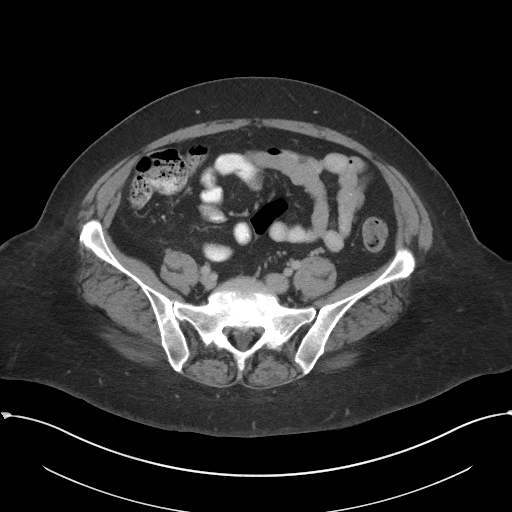
[im 54/107  soft-tissue]
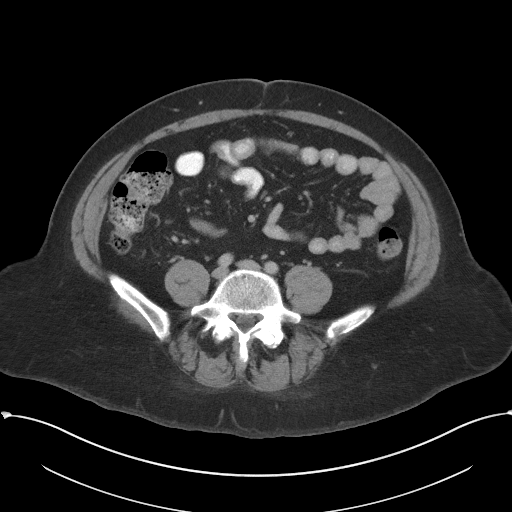
[im 59/107  soft-tissue]
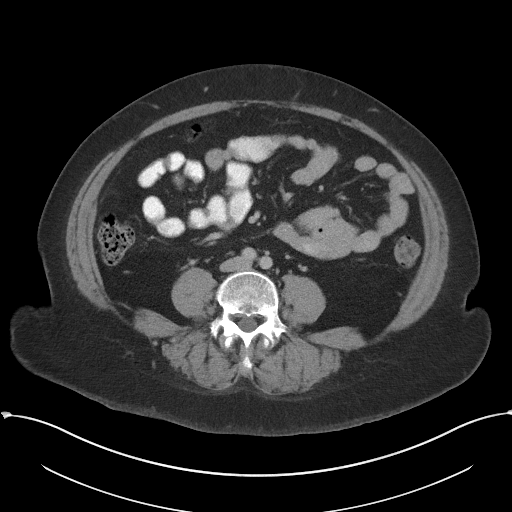
[im 71/107  soft-tissue]
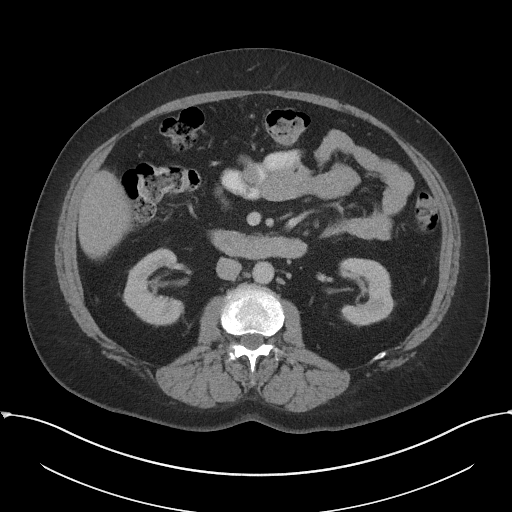
[im 71/107  bone]
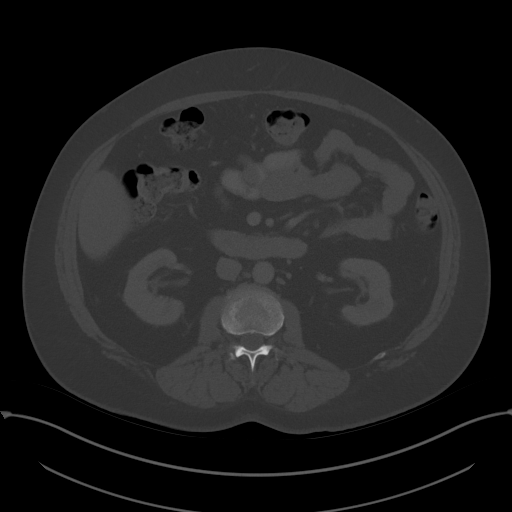
[im 77/107  soft-tissue]
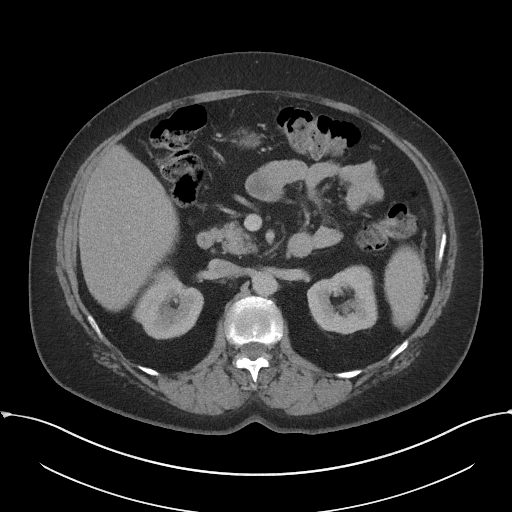
[im 83/107  soft-tissue]
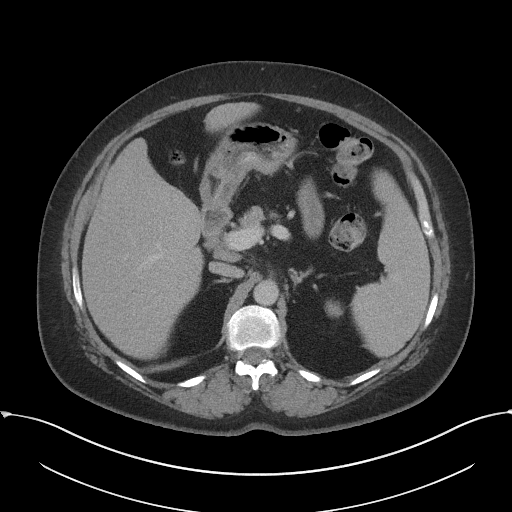
[im 95/107  soft-tissue]
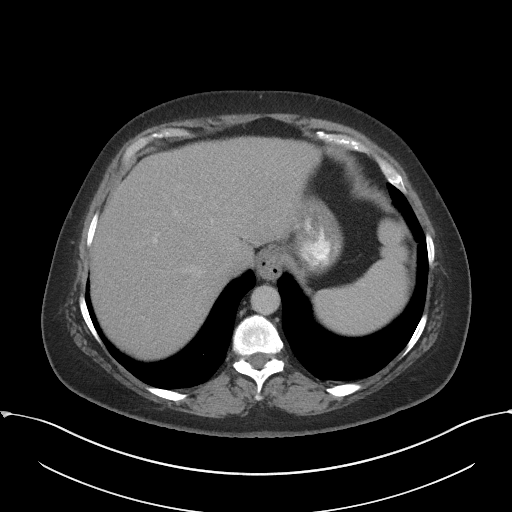
[im 101/107  soft-tissue]
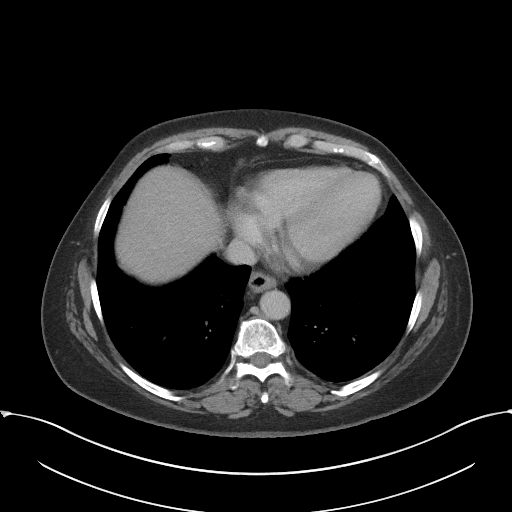

[Series 4: coronal st · coronal · 0.80mm/px · 3 of 110 slices shown]
[im 37/110  soft-tissue]
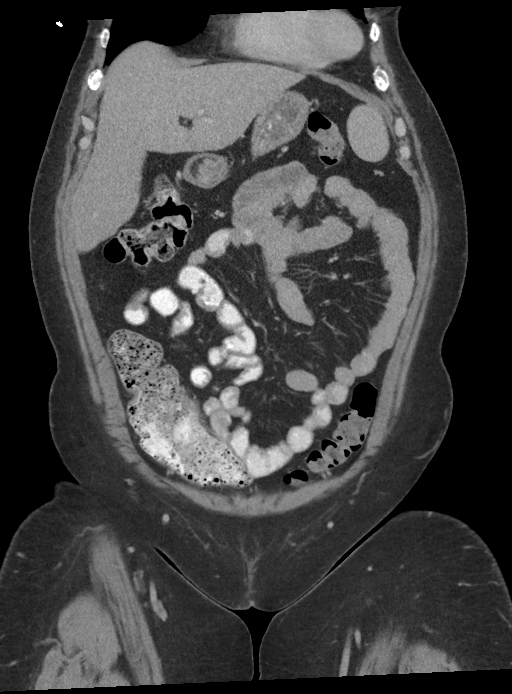
[im 49/110  soft-tissue]
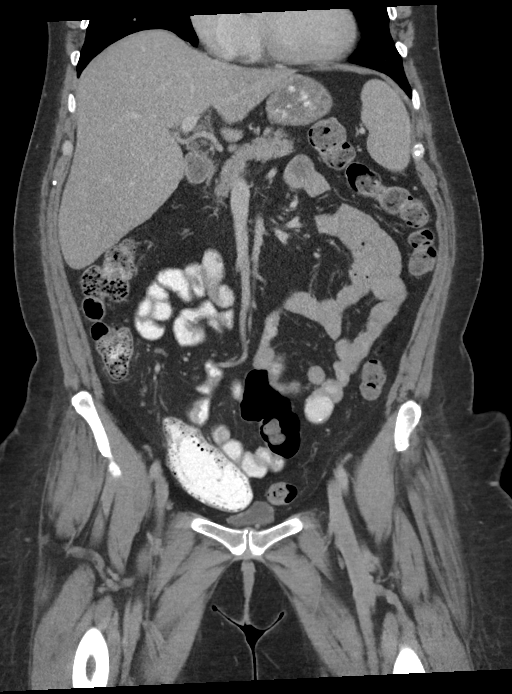
[im 61/110  soft-tissue]
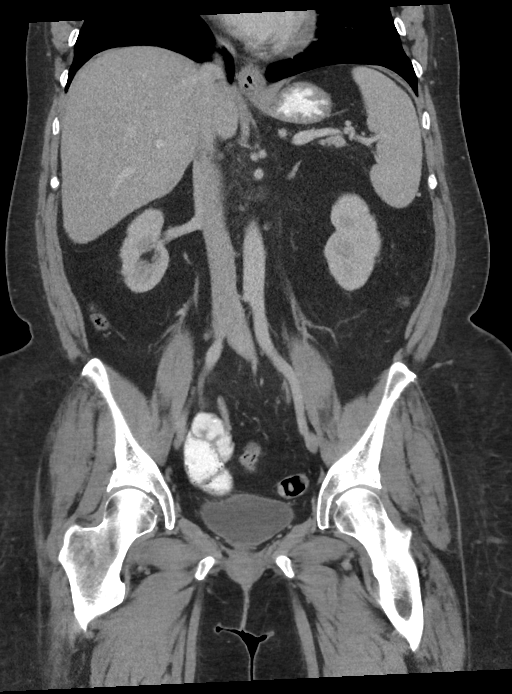

[16 of 46 positions shown; findings below may reference images not displayed]

RADIATION DOSE REDUCTION: This exam was performed according to the
departmental dose-optimization program which includes automated
exposure control, adjustment of the mA and/or kV according to
patient size and/or use of iterative reconstruction technique.

CONTRAST:  100mL OMNIPAQUE IOHEXOL 300 MG/ML  SOLN
FINDINGS: Lower chest: No acute abnormality

Hepatobiliary: No focal liver abnormality is seen. Status post
cholecystectomy. No biliary dilatation.

Pancreas: No focal abnormality or ductal dilatation.

Spleen: No focal abnormality.  Normal size.

Adrenals/Urinary Tract: No adrenal abnormality. No focal renal
abnormality. No stones or hydronephrosis. Urinary bladder is
unremarkable.

Stomach/Bowel: Stomach, large and small bowel grossly unremarkable.

Vascular/Lymphatic: No evidence of aneurysm or adenopathy.

Reproductive: Prior hysterectomy.  No adnexal masses.

Other: No free fluid or free air.

Musculoskeletal: No acute bony abnormality.
IMPRESSION: No acute findings in the abdomen or pelvis.

## 2023-01-23 ENCOUNTER — Encounter (INDEPENDENT_AMBULATORY_CARE_PROVIDER_SITE_OTHER): Payer: Self-pay | Admitting: Vascular Surgery

## 2023-01-23 ENCOUNTER — Ambulatory Visit (INDEPENDENT_AMBULATORY_CARE_PROVIDER_SITE_OTHER): Payer: 59 | Admitting: Vascular Surgery

## 2023-01-23 VITALS — BP 123/73 | HR 73 | Resp 16 | Wt 210.8 lb

## 2023-01-23 DIAGNOSIS — I10 Essential (primary) hypertension: Secondary | ICD-10-CM

## 2023-01-23 DIAGNOSIS — I890 Lymphedema, not elsewhere classified: Secondary | ICD-10-CM

## 2023-01-23 DIAGNOSIS — E119 Type 2 diabetes mellitus without complications: Secondary | ICD-10-CM

## 2023-01-23 NOTE — Assessment & Plan Note (Signed)
Symptoms are stable.  She is using her lymphedema pump as well as her arm sleeve and that seems to be keeping the swelling under good control.  She does have some chronic swelling and likely always will but this is no worse.  She can follow-up on an annual basis.

## 2023-01-23 NOTE — Progress Notes (Signed)
MRN : [id number]  Connie Salazar is a 62 y.o. ([birth year]) female who presents with chief complaint of  Chief Complaint  Patient presents with   Follow-up    18yr follow up  .  History of Present Illness: Patient returns today in follow up of left arm lymphedema.  Her symptoms have been stable.  She has gotten her new sleeve for her arm and wears it daily.  She uses her lymphedema pump again.  When I saw her last year she had been unable to use it much but she is using it regularly now.  Her arm swelling is under good control.  She does have some chronic left arm swelling which she will likely always have, but this is no worse and if anything slightly better.  No new leg swelling or other problems either.  Overall doing well.  Current Outpatient Medications  Medication Sig Dispense Refill   acetaminophen (TYLENOL) 500 MG tablet Take 1,000 mg by mouth every 8 (eight) hours as needed for moderate pain.     Ascorbic Acid (VITAMIN C) 1000 MG tablet Take 1,000 mg by mouth daily.     atorvastatin (LIPITOR) 40 MG tablet Take 40 mg by mouth every morning.     Cholecalciferol (VITAMIN D) 50 MCG (2000 UT) tablet Take 2,000 Units by mouth daily.     diphenhydrAMINE (BENADRYL) 25 MG tablet Take 50 mg by mouth every 12 (twelve) hours as needed for allergies.     empagliflozin (JARDIANCE) 25 MG TABS tablet Take 1 tablet by mouth daily.     estradiol (CLIMARA - DOSED IN MG/24 HR) 0.025 mg/24hr patch Place 1 patch onto the skin once a week.     glipiZIDE (GLUCOTROL) 5 MG tablet Take 5 mg by mouth 2 (two) times daily before a meal.     lisinopril (ZESTRIL) 5 MG tablet Take 5 mg by mouth every morning.     metFORMIN (GLUCOPHAGE) 1000 MG tablet Take 1,000 mg by mouth 2 (two) times daily.     nortriptyline (PAMELOR) 10 MG capsule Take 10 mg by mouth as directed.     propranolol (INDERAL) 10 MG tablet Take 10 mg by mouth 2 (two) times daily.     Zinc 30 MG TABS Take 30 mg by mouth daily.     No current  facility-administered medications for this visit.    Past Medical History:  Diagnosis Date   Asthma    changed states and has had no issues with asthma   Complication of anesthesia    woke up during 1st colonoscopy   Diabetes mellitus without complication (HCC)    Type 2   Fatty liver    Hypertension    Ulcerative colitis (HCC)     Past Surgical History:  Procedure Laterality Date   ABDOMINAL HYSTERECTOMY     CARPOMETACARPAL ([facility]) FUSION OF THUMB Left 11/18/2020   Procedure: CARPOMETACARPAL [facility]) FUSION OF THUMB;  Surgeon: Kennedy Bucker, MD;  Location: [facility] ORS;  Service: Orthopedics;  Laterality: Left;   CHOLECYSTECTOMY     COLONOSCOPY     KNEE ARTHROSCOPY WITH LATERAL MENISECTOMY Left 07/07/2021   Procedure: Left knee arthroscopy, partial medial and lateral menisectomy;  Surgeon: Kennedy Bucker, MD;  Location: [facility] OR[facility];  Service: Orthopedics;  Laterality: Left;     Social History   Tobacco Use   Smoking status: Never   Smokeless tobacco: Never  Vaping Use   Vaping status: Never Used  Substance Use Topics   Alcohol use: Yes  Alcohol/week: 1.0 standard drink of alcohol    Types: 1 Glasses of wine per week    Comment: wine ocassionally   Drug use: Never      Family History  Problem Relation Age of Onset   Breast cancer Mother 73    Allergies  Allergen Reactions   Nsaids Other (See Comments)    Ulcerative colitis   Mesalamine Anxiety    Abdominal Pain     REVIEW OF SYSTEMS (Negative unless checked)  Constitutional: [] Weight loss  [] Fever  [] Chills Cardiac: [] Chest pain   [] Chest pressure   [] Palpitations   [] Shortness of breath when laying flat   [] Shortness of breath at rest   [] Shortness of breath with exertion. Vascular:  [] Pain in legs with walking   [] Pain in legs at rest   [] Pain in legs when laying flat   [] Claudication   [] Pain in feet when walking  [] Pain in feet at rest  [] Pain in feet when laying flat   [] History of DVT   [] Phlebitis   [] Swelling  in legs   [] Varicose veins   [] Non-healing ulcers Pulmonary:   [] Uses home oxygen   [] Productive cough   [] Hemoptysis   [] Wheeze  [] COPD   [] Asthma Neurologic:  [] Dizziness  [] Blackouts   [] Seizures   [] History of stroke   [] History of TIA  [] Aphasia   [] Temporary blindness   [] Dysphagia   [] Weakness or numbness in arms   [] Weakness or numbness in legs Musculoskeletal:  [x] Arthritis   [] Joint swelling   [] Joint pain   [] Low back pain Hematologic:  [] Easy bruising  [] Easy bleeding   [] Hypercoagulable state   [] Anemic   Gastrointestinal:  [] Blood in stool   [] Vomiting blood  [] Gastroesophageal reflux/heartburn   [] Abdominal pain Genitourinary:  [] Chronic kidney disease   [] Difficult urination  [] Frequent urination  [] Burning with urination   [] Hematuria Skin:  [] Rashes   [] Ulcers   [] Wounds Psychological:  [] History of anxiety   []  History of major depression.  Physical Examination  BP 123/73 (BP Location: Left Arm)   Pulse 73   Resp 16   Wt 210 lb 12.8 oz (95.6 kg)   BMI 35.08 kg/m  Gen:  WD/WN, NAD Head: /AT, No temporalis wasting. Ear/Nose/Throat: Hearing grossly intact, nares w/o erythema or drainage Eyes: Conjunctiva clear. Sclera non-icteric Neck: Supple.  Trachea midline Pulmonary:  Good air movement, no use of accessory muscles.  Cardiac: RRR, no JVD Vascular:  Vessel Right Left  Radial Palpable Palpable                          PT Palpable Palpable  DP Palpable Palpable   Gastrointestinal: soft, non-tender/non-distended. No guarding/reflex.  Musculoskeletal: M/S 5/5 throughout.  No deformity or atrophy.  1+ left upper extremity edema. Neurologic: Sensation grossly intact in extremities.  Symmetrical.  Speech is fluent.  Psychiatric: Judgment intact, Mood & affect appropriate for pt's clinical situation. Dermatologic: No rashes or ulcers noted.  No cellulitis or open wounds.      Labs No results found for this or any previous visit (from the past 2160  hour(s)).  Radiology No results found.  Assessment/Plan  Lymphedema Symptoms are stable.  She is using her lymphedema pump as well as her arm sleeve and that seems to be keeping the swelling under good control.  She does have some chronic swelling and likely always will but this is no worse.  She can follow-up on an annual basis.  Type 2  diabetes mellitus without complication, without long-term current use of insulin (HCC) blood glucose control important in reducing the progression of atherosclerotic disease. Also, involved in wound healing. On appropriate medications.     Essential hypertension blood pressure control important in reducing the progression of atherosclerotic disease. On appropriate oral medications.  Festus Barren, MD  01/23/2023 9:14 AM    This note was created with Dragon medical transcription system.  Any errors from dictation are purely unintentional

## 2023-07-17 ENCOUNTER — Encounter (INDEPENDENT_AMBULATORY_CARE_PROVIDER_SITE_OTHER): Payer: Self-pay

## 2023-09-21 ENCOUNTER — Other Ambulatory Visit: Payer: Self-pay | Admitting: Internal Medicine

## 2023-09-21 DIAGNOSIS — Z1231 Encounter for screening mammogram for malignant neoplasm of breast: Secondary | ICD-10-CM

## 2023-10-11 ENCOUNTER — Encounter

## 2023-10-30 ENCOUNTER — Ambulatory Visit
Admission: RE | Admit: 2023-10-30 | Discharge: 2023-10-30 | Disposition: A | Discharge status: Home / Self Care | Payer: Self-pay | Source: Ambulatory Visit | Attending: Internal Medicine | Admitting: Internal Medicine

## 2023-10-30 DIAGNOSIS — Z1231 Encounter for screening mammogram for malignant neoplasm of breast: Secondary | ICD-10-CM | POA: Diagnosis present

## 2024-01-22 ENCOUNTER — Ambulatory Visit (INDEPENDENT_AMBULATORY_CARE_PROVIDER_SITE_OTHER): Payer: 59 | Admitting: Vascular Surgery

## 2024-01-22 ENCOUNTER — Encounter (INDEPENDENT_AMBULATORY_CARE_PROVIDER_SITE_OTHER): Payer: Self-pay | Admitting: Vascular Surgery

## 2024-01-22 VITALS — BP 112/79 | HR 80 | Resp 16 | Ht 65.0 in | Wt 202.0 lb

## 2024-01-22 DIAGNOSIS — E119 Type 2 diabetes mellitus without complications: Secondary | ICD-10-CM

## 2024-01-22 DIAGNOSIS — I890 Lymphedema, not elsewhere classified: Secondary | ICD-10-CM | POA: Diagnosis not present

## 2024-01-22 DIAGNOSIS — I10 Essential (primary) hypertension: Secondary | ICD-10-CM

## 2024-01-22 NOTE — Progress Notes (Signed)
 MRN : [id number]  Connie Salazar is a 63 y.o. (01/26/1961) female who presents with chief complaint of  Chief Complaint  Patient presents with   Follow-up    1 year no studies  .  History of Present Illness:   Discussed the use of AI scribe software for clinical note transcription with the patient, who gave verbal consent to proceed.  History of Present Illness Connie Salazar is a 63 year old female who presents with chronic swelling.  Her chronic left arm swelling is stable compared with a year ago, though she notes a slight recent increase in pain. She uses a compression sleeve regularly, which clearly reduces swelling, and she notes rapid worsening of swelling when she does not wear it.  She has previously had some swelling in her leg although that has always been a more mild issue.  There are no new leg sores or other changes, and she feels the swelling is overall mild. She has had no new major medical issues or procedures in the past year.    Results    Current Outpatient Medications  Medication Sig Dispense Refill   acetaminophen  (TYLENOL ) 500 MG tablet Take 1,000 mg by mouth every 8 (eight) hours as needed for moderate pain.     Ascorbic Acid (VITAMIN C) 1000 MG tablet Take 1,000 mg by mouth daily.     Cholecalciferol (VITAMIN D) 50 MCG (2000 UT) tablet Take 2,000 Units by mouth daily.     diphenhydrAMINE (BENADRYL) 25 MG tablet Take 50 mg by mouth every 12 (twelve) hours as needed for allergies.     glipiZIDE (GLUCOTROL) 5 MG tablet Take 5 mg by mouth 2 (two) times daily before a meal.     lisinopril (ZESTRIL) 5 MG tablet Take 5 mg by mouth every morning.     metFORMIN (GLUCOPHAGE) 1000 MG tablet Take 1,000 mg by mouth 2 (two) times daily.     nortriptyline (PAMELOR) 10 MG capsule Take 10 mg by mouth as directed.     rosuvastatin (CRESTOR) 10 MG tablet Take 10 mg by mouth daily.     Zinc 30 MG TABS Take 30 mg by mouth daily.     atorvastatin (LIPITOR) 40 MG tablet Take 40  mg by mouth every morning. (Patient not taking: Reported on 01/22/2024)     estradiol (CLIMARA - DOSED IN MG/24 HR) 0.025 mg/24hr patch Place 1 patch onto the skin once a week. (Patient not taking: Reported on 01/22/2024)     JARDIANCE 25 MG TABS tablet Take 25 mg by mouth daily.     propranolol (INDERAL) 10 MG tablet Take 10 mg by mouth 2 (two) times daily. (Patient not taking: Reported on 01/22/2024)     No current facility-administered medications for this visit.    Past Medical History:  Diagnosis Date   Asthma    changed states and has had no issues with asthma   Complication of anesthesia    woke up during 1st colonoscopy   Diabetes mellitus without complication (HCC)    Type 2   Fatty liver    Hypertension    Ulcerative colitis (HCC)     Past Surgical History:  Procedure Laterality Date   ABDOMINAL HYSTERECTOMY     CARPOMETACARPAL ([facility]) FUSION OF THUMB Left 11/18/2020   Procedure: CARPOMETACARPAL [facility]) FUSION OF THUMB;  Surgeon: Kathlynn Sharper, MD;  Location: [facility] ORS;  Service: Orthopedics;  Laterality: Left;   CHOLECYSTECTOMY     COLONOSCOPY     KNEE  ARTHROSCOPY WITH LATERAL MENISECTOMY Left 07/07/2021   Procedure: Left knee arthroscopy, partial medial and lateral menisectomy;  Surgeon: Kathlynn Sharper, MD;  Location: [facility] ORS;  Service: Orthopedics;  Laterality: Left;     Social History   Tobacco Use   Smoking status: Never   Smokeless tobacco: Never  Vaping Use   Vaping status: Never Used  Substance Use Topics   Alcohol use: Yes    Alcohol/week: 1.0 standard drink of alcohol    Types: 1 Glasses of wine per week    Comment: wine ocassionally   Drug use: Never      Family History  Problem Relation Age of Onset   Breast cancer Mother 8     Allergies  Allergen Reactions   Nsaids Other (See Comments)    Ulcerative colitis   Mesalamine Anxiety    Abdominal Pain     REVIEW OF SYSTEMS (Negative unless checked)  Constitutional: [] Weight loss  [] Fever   [] Chills Cardiac: [] Chest pain   [] Chest pressure   [] Palpitations   [] Shortness of breath when laying flat   [] Shortness of breath at rest   [] Shortness of breath with exertion. Vascular:  [] Pain in legs with walking   [] Pain in legs at rest   [] Pain in legs when laying flat   [] Claudication   [] Pain in feet when walking  [] Pain in feet at rest  [] Pain in feet when laying flat   [] History of DVT   [] Phlebitis   [] Swelling in legs   [] Varicose veins   [] Non-healing ulcers Pulmonary:   [] Uses home oxygen   [] Productive cough   [] Hemoptysis   [] Wheeze  [] COPD   [] Asthma Neurologic:  [] Dizziness  [] Blackouts   [] Seizures   [] History of stroke   [] History of TIA  [] Aphasia   [] Temporary blindness   [] Dysphagia   [] Weakness or numbness in arms   [] Weakness or numbness in legs Musculoskeletal:  [x] Arthritis   [] Joint swelling   [] Joint pain   [] Low back pain Hematologic:  [] Easy bruising  [] Easy bleeding   [] Hypercoagulable state   [] Anemic   Gastrointestinal:  [] Blood in stool   [] Vomiting blood  [] Gastroesophageal reflux/heartburn   [] Abdominal pain Genitourinary:  [] Chronic kidney disease   [] Difficult urination  [] Frequent urination  [] Burning with urination   [] Hematuria Skin:  [] Rashes   [] Ulcers   [] Wounds Psychological:  [] History of anxiety   []  History of major depression.  Physical Examination  BP 112/79   Pulse 80   Resp 16   Ht 5' 5 (1.651 m)   Wt 202 lb (91.6 kg)   BMI 33.61 kg/m  Gen:  WD/WN, NAD Head: [place]/AT, No temporalis wasting. Ear/Nose/Throat: Hearing grossly intact, nares w/o erythema or drainage Eyes: Conjunctiva clear. Sclera non-icteric Neck: Supple.  Trachea midline Pulmonary:  Good air movement, no use of accessory muscles.  Cardiac: RRR, no JVD Vascular:  Vessel Right Left  Radial Palpable Palpable               Musculoskeletal: M/S 5/5 throughout.  No deformity or atrophy.  Mild left upper extremity edema. Neurologic: Sensation grossly intact in extremities.   Symmetrical.  Speech is fluent.  Psychiatric: Judgment intact, Mood & affect appropriate for pt's clinical situation. Dermatologic: No rashes or ulcers noted.  No cellulitis or open wounds.  Physical Exam      Labs No results found for this or any previous visit (from the past 2160 hours).  Radiology No results found.  Assessment/Plan  No problem-specific Assessment &  Plan notes found for this encounter.  Assessment and Plan Assessment & Plan Lymphedema Chronic lymphedema with mild swelling and discomfort.  Wears her compression sleeve regularly which does help her symptoms.. No new procedures or surgeries indicated. - Continue wearing compression sleeve. - Elevate arm to reduce swelling. - Engage in regular exercise to promote fluid and blood return. - Scheduled annual follow-up for reassessment and prescription renewal if needed.  Type 2 diabetes mellitus without complication, without long-term current use of insulin (HCC) blood glucose control important in reducing the progression of atherosclerotic disease. Also, involved in wound healing. On appropriate medications.     Essential hypertension blood pressure control important in reducing the progression of atherosclerotic disease. On appropriate oral medications.   Selinda Gu, MD  01/22/2024 2:55 PM    This note was created with Dragon medical transcription system.  Any errors from dictation are purely unintentional

## 2025-01-20 ENCOUNTER — Ambulatory Visit (INDEPENDENT_AMBULATORY_CARE_PROVIDER_SITE_OTHER): Admitting: Vascular Surgery
